# Patient Record
Sex: Female | Born: 1985
Health system: Southern US, Community
[De-identification: ages and names within clinical notes are randomized; demographics above are authoritative.]

## PROBLEM LIST (undated history)

## (undated) DIAGNOSIS — G43009 Migraine without aura, not intractable, without status migrainosus: Secondary | ICD-10-CM

## (undated) DIAGNOSIS — M47816 Spondylosis without myelopathy or radiculopathy, lumbar region: Secondary | ICD-10-CM

## (undated) DIAGNOSIS — K219 Gastro-esophageal reflux disease without esophagitis: Secondary | ICD-10-CM

## (undated) DIAGNOSIS — N83209 Unspecified ovarian cyst, unspecified side: Secondary | ICD-10-CM

## (undated) DIAGNOSIS — N289 Disorder of kidney and ureter, unspecified: Secondary | ICD-10-CM

## (undated) DIAGNOSIS — K589 Irritable bowel syndrome without diarrhea: Secondary | ICD-10-CM

## (undated) DIAGNOSIS — G43909 Migraine, unspecified, not intractable, without status migrainosus: Secondary | ICD-10-CM

## (undated) HISTORY — DX: Morbid (severe) obesity due to excess calories: E66.01

## (undated) HISTORY — DX: Migraine without aura, not intractable, without status migrainosus: G43.009

## (undated) HISTORY — PX: HAND SURGERY: SHX662

---
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
## 2007-08-27 ENCOUNTER — Ambulatory Visit: Payer: Self-pay | Admitting: Cardiology

## 2011-01-08 ENCOUNTER — Ambulatory Visit (HOSPITAL_BASED_OUTPATIENT_CLINIC_OR_DEPARTMENT_OTHER)
Admission: RE | Admit: 2011-01-08 | Discharge: 2011-01-08 | Disposition: A | Discharge status: Home / Self Care | Payer: Federal, State, Local not specified - PPO | Source: Ambulatory Visit | Attending: Orthopedic Surgery | Admitting: Orthopedic Surgery

## 2011-01-08 DIAGNOSIS — F17200 Nicotine dependence, unspecified, uncomplicated: Secondary | ICD-10-CM | POA: Insufficient documentation

## 2011-01-08 DIAGNOSIS — E669 Obesity, unspecified: Secondary | ICD-10-CM | POA: Insufficient documentation

## 2011-01-08 DIAGNOSIS — Z01812 Encounter for preprocedural laboratory examination: Secondary | ICD-10-CM | POA: Insufficient documentation

## 2011-01-08 DIAGNOSIS — W19XXXA Unspecified fall, initial encounter: Secondary | ICD-10-CM | POA: Insufficient documentation

## 2011-01-08 DIAGNOSIS — S62309A Unspecified fracture of unspecified metacarpal bone, initial encounter for closed fracture: Secondary | ICD-10-CM | POA: Insufficient documentation

## 2011-02-13 NOTE — Op Note (Signed)
[name], [name] NO.:  [id number]  MEDICAL RECORD NO.:  [id number]  LOCATION:                                 FACILITY:  PHYSICIAN:  Jones Broom, MD         DATE OF BIRTH:  DATE OF PROCEDURE:  01/08/2011 DATE OF DISCHARGE:                              OPERATIVE REPORT   PREOPERATIVE DIAGNOSIS:  Right fifth metacarpal fracture.  POSTOPERATIVE DIAGNOSIS:  Right fifth metacarpal fracture.  PROCEDURE PERFORMED:  Open reduction and internal fixation right fifth metacarpal fracture.  ATTENDING SURGEON:  Berline Lopes, MD  ASSISTANT:  None.  ANESTHESIA:  GETA.  COMPLICATIONS:  None.  DRAINS:  None.  SPECIMENS:  None.  ESTIMATED BLOOD LOSS:  Minimal.  TOURNIQUET TIME:  38 minutes at 250 mmHg.  INDICATIONS FOR SURGERY:  The patient is a 25 year old healthy female who had a fall and suffered a right fifth metacarpal fracture.  She was seen by my partner, Dr. Althea Charon who evaluated her.  She was found to have about 45 degrees angulation of the fracture with rotational deformity of the finger.  We discussed the possibility of attempt at reduction and splinting but she wanted to go ahead with surgery to get it fixed.  Given the rotational deformity I think it is a reasonable option.  Given the angulation and rotational deformity I felt that plate fixation will be the most reliable method to hold the appropriate reduction.  She understood risks, benefits and alternatives to surgery including but not limited to risk of bleeding, infection, damage to neurovascular structures, risk of nonunion, malunion and potential need for future surgery.  She elected to go forward with surgery.  PROCEDURE:  The patient was identified in the preoperative holding area where I personally marked the operative site after verifying site, side, and procedure with the patient.  She was taken back to the operating room where general anesthesia was induced without  complication.  The right upper extremity was prepped and draped in standard sterile fashion and a nonsterile tourniquet was applied in the upper arm.  The patient did receive IV antibiotics prior to elevation of tourniquet.  The appropriate time-out procedure was carried out.  The limb was exsanguinated and the tourniquet was elevated to 250 mmHg.  An approximately 3-cm incision was made over the dorsal lateral aspect of the fifth metacarpal centered over the palpable fracture site. Dissection was carried down to subcutaneous tissues carefully avoiding any small nerve cutaneous branches.  The extensor tendons were retracted radially and knife was used to come down through the periosteum. Subperiosteal dissection was used to the fracture site and the fracture was completely visualized.  It was cleaned of hematoma and the fracture reduction was obtained with extension at the fracture.  Fluoroscopic imaging demonstrated appropriate fracture reduction.  A 4-hole DePuy hand fracture plate was placed with one compression hole distally.  The proximal hole was drilled, measured and filled bringing the plate down to the bone and then the distal hole was drilled, measured, and filled in compression.  The plate was prebent prior to application and excellent compression was noted at the fracture site after the  placement of a second screw.  Fluoroscopic imaging demonstrated appropriate placement of the screw and plate.  The proximal and distal screws were then drilled, measured and filled with appropriate sized nonlocking screws.  Excellent fixation was noted at each screw.  Final fluoroscopic imaging in AP and lateral planes demonstrated anatomic reduction of the fracture with excellent compression.  The wound was then copiously irrigated with normal saline and the skin was closed with 4-0 Prolene in interrupted fashion.  Sterile dressings were then applied including Adaptic, 4x4s and sterile Webril.   I should say that I did infiltrate the wound with 6 mL of 0.5% Marcaine prior to application of dressing. A well-padded well molded ulnar gutter splint was then applied in the safe position and tourniquet was let down for total tourniquet time of 38 minutes at 250 mmHg.  The patient was then allowed to awaken from general anesthesia, transferred to the stretcher, and taken to the recovery room in stable condition.  POSTOPERATIVE PLAN:  She will be discharged home in stable condition. She will keep the splint clean, dry, and intact until followup.  She will follow up with me in 7-10 days at which point we will get her out of splint, start moving her removable hand-based brace for protection.     Jones Broom, MD     JC/MEDQ  D:  01/08/2011  T:  01/09/2011  Job:  [id number]  Electronically Signed by Jones Broom  on 02/13/2011 03:55:40 PM

## 2011-09-05 ENCOUNTER — Other Ambulatory Visit (HOSPITAL_COMMUNITY)
Admission: RE | Admit: 2011-09-05 | Discharge: 2011-09-05 | Disposition: A | Discharge status: Home / Self Care | Payer: Federal, State, Local not specified - PPO | Source: Ambulatory Visit | Attending: Family Medicine | Admitting: Family Medicine

## 2011-09-05 ENCOUNTER — Other Ambulatory Visit: Payer: Self-pay | Admitting: Family Medicine

## 2011-09-05 DIAGNOSIS — Z124 Encounter for screening for malignant neoplasm of cervix: Secondary | ICD-10-CM | POA: Insufficient documentation

## 2011-12-25 ENCOUNTER — Emergency Department (HOSPITAL_COMMUNITY): Payer: Federal, State, Local not specified - PPO

## 2011-12-25 ENCOUNTER — Emergency Department (HOSPITAL_COMMUNITY)
Admission: EM | Admit: 2011-12-25 | Discharge: 2011-12-25 | Disposition: A | Discharge status: Home / Self Care | Payer: Federal, State, Local not specified - PPO | Attending: Emergency Medicine | Admitting: Emergency Medicine

## 2011-12-25 ENCOUNTER — Encounter (HOSPITAL_COMMUNITY): Payer: Self-pay | Admitting: *Deleted

## 2011-12-25 DIAGNOSIS — K589 Irritable bowel syndrome without diarrhea: Secondary | ICD-10-CM | POA: Insufficient documentation

## 2011-12-25 DIAGNOSIS — F17200 Nicotine dependence, unspecified, uncomplicated: Secondary | ICD-10-CM | POA: Insufficient documentation

## 2011-12-25 DIAGNOSIS — N83209 Unspecified ovarian cyst, unspecified side: Secondary | ICD-10-CM | POA: Insufficient documentation

## 2011-12-25 DIAGNOSIS — R109 Unspecified abdominal pain: Secondary | ICD-10-CM

## 2011-12-25 DIAGNOSIS — K219 Gastro-esophageal reflux disease without esophagitis: Secondary | ICD-10-CM | POA: Insufficient documentation

## 2011-12-25 DIAGNOSIS — R110 Nausea: Secondary | ICD-10-CM | POA: Insufficient documentation

## 2011-12-25 DIAGNOSIS — N949 Unspecified condition associated with female genital organs and menstrual cycle: Secondary | ICD-10-CM | POA: Insufficient documentation

## 2011-12-25 DIAGNOSIS — K5900 Constipation, unspecified: Secondary | ICD-10-CM

## 2011-12-25 HISTORY — DX: Gastro-esophageal reflux disease without esophagitis: K21.9

## 2011-12-25 HISTORY — DX: Unspecified ovarian cyst, unspecified side: N83.209

## 2011-12-25 HISTORY — DX: Irritable bowel syndrome, unspecified: K58.9

## 2011-12-25 LAB — DIFFERENTIAL
Basophils Absolute: 0.1 10*3/uL (ref 0.0–0.1)
Basophils Relative: 1 % (ref 0–1)
Eosinophils Absolute: 0.3 10*3/uL (ref 0.0–0.7)
Eosinophils Relative: 4 % (ref 0–5)
Monocytes Absolute: 0.3 10*3/uL (ref 0.1–1.0)
Neutro Abs: 2.5 10*3/uL (ref 1.7–7.7)

## 2011-12-25 LAB — CBC
Hemoglobin: 12.9 g/dL (ref 12.0–15.0)
MCH: 24.9 pg — ABNORMAL LOW (ref 26.0–34.0)
Platelets: 508 10*3/uL — ABNORMAL HIGH (ref 150–400)
RBC: 5.19 MIL/uL — ABNORMAL HIGH (ref 3.87–5.11)
WBC: 6.3 10*3/uL (ref 4.0–10.5)

## 2011-12-25 LAB — WET PREP, GENITAL
Clue Cells Wet Prep HPF POC: NONE SEEN
Trich, Wet Prep: NONE SEEN
Yeast Wet Prep HPF POC: NONE SEEN

## 2011-12-25 LAB — URINALYSIS, ROUTINE W REFLEX MICROSCOPIC
Protein, ur: NEGATIVE mg/dL
pH: 6 (ref 5.0–8.0)

## 2011-12-25 LAB — POCT I-STAT, CHEM 8
BUN: 6 mg/dL (ref 6–23)
Chloride: 105 mEq/L (ref 96–112)
Creatinine, Ser: 1.1 mg/dL (ref 0.50–1.10)
Sodium: 143 mEq/L (ref 135–145)

## 2011-12-25 LAB — URINE MICROSCOPIC-ADD ON

## 2011-12-25 MED ORDER — HYDROCODONE-ACETAMINOPHEN 5-500 MG PO TABS: 1.0000 | ORAL_TABLET | Freq: Four times a day (QID) | ORAL | Status: AC | PRN

## 2011-12-25 NOTE — Discharge Instructions (Signed)
Take miralax twice a day for constipation. Exercise regularly.  Vicodin for severe pain only as needed. Your labs and x-ray otherwise are normal. Please follow up with you GYN and stomach specialist as soon as able for further testing.   Constipation in Adults Constipation is having fewer than 2 bowel movements per week. Usually, the stools are hard. As we grow older, constipation is more common. If you try to fix constipation with laxatives, the problem may get worse. This is because laxatives taken over a long period of time make the colon muscles weaker. A low-fiber diet, not taking in enough fluids, and taking some medicines may make these problems worse. MEDICATIONS THAT MAY CAUSE CONSTIPATION  Water pills (diuretics).   Calcium channel blockers (used to control blood pressure and for the heart).   Certain pain medicines (narcotics).   Anticholinergics.   Anti-inflammatory agents.   Antacids that contain aluminum.  DISEASES THAT CONTRIBUTE TO CONSTIPATION  Diabetes.   Parkinson's disease.   Dementia.   Stroke.   Depression.   Illnesses that cause problems with salt and water metabolism.  HOME CARE INSTRUCTIONS   Constipation is usually best cared for without medicines. Increasing dietary fiber and eating more fruits and vegetables is the best way to manage constipation.   Slowly increase fiber intake to 25 to 38 grams per day. Whole grains, fruits, vegetables, and legumes are good sources of fiber. A dietitian can further help you incorporate high-fiber foods into your diet.   Drink enough water and fluids to keep your urine clear or pale yellow.   A fiber supplement may be added to your diet if you cannot get enough fiber from foods.   Increasing your activities also helps improve regularity.   Suppositories, as suggested by your caregiver, will also help. If you are using antacids, such as aluminum or calcium containing products, it will be helpful to switch to  products containing magnesium if your caregiver says it is okay.   If you have been given a liquid injection (enema) today, this is only a temporary measure. It should not be relied on for treatment of longstanding (chronic) constipation.   Stronger measures, such as magnesium sulfate, should be avoided if possible. This may cause uncontrollable diarrhea. Using magnesium sulfate may not allow you time to make it to the bathroom.  SEEK IMMEDIATE MEDICAL CARE IF:   There is bright red blood in the stool.   The constipation stays for more than 4 days.   There is belly (abdominal) or rectal pain.   You do not seem to be getting better.   You have any questions or concerns.  MAKE SURE YOU:   Understand these instructions.   Will watch your condition.   Will get help right away if you are not doing well or get worse.  Document Released: 04/06/2004 Document Revised: 06/28/2011 Document Reviewed: 06/12/2011 [facility] Patient Information 2012 [place], [place].  Ovarian Cyst The ovaries are small organs that are on each side of the uterus. The ovaries are the organs that produce the female hormones, estrogen and progesterone. An ovarian cyst is a sac filled with fluid that can vary in its size. It is normal for a small cyst to form in women who are in the childbearing age and who have menstrual periods. This type of cyst is called a follicle cyst that becomes an ovulation cyst (corpus luteum cyst) after it produces the women's egg. It later goes away on its own if the woman does  not become pregnant. There are other kinds of ovarian cysts that may cause problems and may need to be treated. The most serious problem is a cyst with cancer. It should be noted that menopausal women who have an ovarian cyst are at a higher risk of it being a cancer cyst. They should be evaluated very quickly, thoroughly and followed closely. This is especially true in menopausal women because of the high rate of ovarian  cancer in women in menopause. CAUSES AND TYPES OF OVARIAN CYSTS:  FUNCTIONAL CYST: The follicle/corpus luteum cyst is a functional cyst that occurs every month during ovulation with the menstrual cycle. They go away with the next menstrual cycle if the woman does not get pregnant. Usually, there are no symptoms with a functional cyst.   ENDOMETRIOMA CYST: This cyst develops from the lining of the uterus tissue. This cyst gets in or on the ovary. It grows every month from the bleeding during the menstrual period. It is also called a "chocolate cyst" because it becomes filled with blood that turns brown. This cyst can cause pain in the lower abdomen during intercourse and with your menstrual period.   CYSTADENOMA CYST: This cyst develops from the cells on the outside of the ovary. They usually are not cancerous. They can get very big and cause lower abdomen pain and pain with intercourse. This type of cyst can twist on itself, cut off its blood supply and cause severe pain. It also can easily rupture and cause a lot of pain.   DERMOID CYST: This type of cyst is sometimes found in both ovaries. They are found to have different kinds of body tissue in the cyst. The tissue includes skin, teeth, hair, and/or cartilage. They usually do not have symptoms unless they get very big. Dermoid cysts are rarely cancerous.   POLYCYSTIC OVARY: This is a rare condition with hormone problems that produces many small cysts on both ovaries. The cysts are follicle-like cysts that never produce an egg and become a corpus luteum. It can cause an increase in body weight, infertility, acne, increase in body and facial hair and lack of menstrual periods or rare menstrual periods. Many women with this problem develop type 2 diabetes. The exact cause of this problem is unknown. A polycystic ovary is rarely cancerous.   THECA LUTEIN CYST: Occurs when too much hormone (human chorionic gonadotropin) is produced and over-stimulates the  ovaries to produce an egg. They are frequently seen when doctors stimulate the ovaries for invitro-fertilization (test tube babies).   LUTEOMA CYST: This cyst is seen during pregnancy. Rarely it can cause an obstruction to the birth canal during labor and delivery. They usually go away after delivery.  SYMPTOMS   Pelvic pain or pressure.   Pain during sexual intercourse.   Increasing girth (swelling) of the abdomen.   Abnormal menstrual periods.   Increasing pain with menstrual periods.   You stop having menstrual periods and you are not pregnant.  DIAGNOSIS  The diagnosis can be made during:  Routine or annual pelvic examination (common).   Ultrasound.   X-ray of the pelvis.   CT Scan.   MRI.   Blood tests.  TREATMENT   Treatment may only be to follow the cyst monthly for 2 to 3 months with your caregiver. Many go away on their own, especially functional cysts.   May be aspirated (drained) with a long needle with ultrasound, or by laparoscopy (inserting a tube into the pelvis through a small incision).  The whole cyst can be removed by laparoscopy.   Sometimes the cyst may need to be removed through an incision in the lower abdomen.   Hormone treatment is sometimes used to help dissolve certain cysts.   Birth control pills are sometimes used to help dissolve certain cysts.  HOME CARE INSTRUCTIONS  Follow your caregiver's advice regarding:  Medicine.   Follow up visits to evaluate and treat the cyst.   You may need to come back or make an appointment with another caregiver, to find the exact cause of your cyst, if your caregiver is not a gynecologist.   Get your yearly and recommended pelvic examinations and Pap tests.   Let your caregiver know if you have had an ovarian cyst in the past.  SEEK MEDICAL CARE IF:   Your periods are late, irregular, they stop, or are painful.   Your stomach (abdomen) or pelvic pain does not go away.   Your stomach becomes  larger or swollen.   You have pressure on your bladder or trouble emptying your bladder completely.   You have painful sexual intercourse.   You have feelings of fullness, pressure, or discomfort in your stomach.   You lose weight for no apparent reason.   You feel generally ill.   You become constipated.   You lose your appetite.   You develop acne.   You have an increase in body and facial hair.   You are gaining weight, without changing your exercise and eating habits.   You think you are pregnant.  SEEK IMMEDIATE MEDICAL CARE IF:   You have increasing abdominal pain.   You feel sick to your stomach (nausea) and/or vomit.   You develop a fever that comes on suddenly.   You develop abdominal pain during a bowel movement.   Your menstrual periods become heavier than usual.  Document Released: 07/09/2005 Document Revised: 06/28/2011 Document Reviewed: 05/12/2009 [facility] Patient Information 2012 [place], [place].

## 2011-12-25 NOTE — ED Notes (Signed)
Patient is resting comfortably. 

## 2011-12-25 NOTE — ED Provider Notes (Signed)
History     CSN: [id number]  Arrival date & time 12/25/11  1604   First MD Initiated Contact with Patient 12/25/11 1729      Chief Complaint  Patient presents with  . Pelvic Pain    (Consider location/radiation/quality/duration/timing/severity/associated sxs/prior treatment) Patient is a 26 y.o. female presenting with abdominal pain. The history is provided by the patient.  Abdominal Pain The primary symptoms of the illness include abdominal pain and nausea. The primary symptoms of the illness do not include fever, vomiting, diarrhea, dysuria, vaginal discharge or vaginal bleeding. The onset of the illness was gradual.  The patient states that she believes she is currently not pregnant. Symptoms associated with the illness do not include chills, constipation, urgency, hematuria or frequency.  Pt states she has had left lower abdominal pain for several months now. Has worsened in the last week. States went to her OB/GYN, had US done last week, which showed an ovarian cyst. States he wanted to start her on OCP to help with the cyst, however, she refused, states "I dont like to take medicine." Pt states she has also had problems with constipation and IBS. States none of the over the counter medications help her. Stats she goes to digestive health specialist. States unable to go for another week. Has ran out of medications they give her and has not had a bowel movement for 4 days. States, "I just need to know what is causing my pain, and if it a cyst, i want it out."   Past Medical History  Diagnosis Date  . Ovarian cyst   . IBS (irritable bowel syndrome)   . GERD (gastroesophageal reflux disease)     Past Surgical History  Procedure Date  . Hand surgery     No family history on file.  History  Substance Use Topics  . Smoking status: Current Some Day Smoker  . Smokeless tobacco: Not on file  . Alcohol Use: Yes     occasionally    OB History    Grav Para Term Preterm Abortions  TAB SAB Ect Mult Living                  Review of Systems  Constitutional: Negative for fever and chills.  Respiratory: Negative.   Cardiovascular: Negative.   Gastrointestinal: Positive for nausea and abdominal pain. Negative for vomiting, diarrhea and constipation.  Genitourinary: Positive for pelvic pain. Negative for dysuria, urgency, frequency, hematuria, vaginal bleeding and vaginal discharge.  Musculoskeletal: Negative.   Skin: Negative.   Neurological: Negative for dizziness and weakness.    Allergies  Penicillins  Home Medications  No current outpatient prescriptions on file.  BP 109/84  Pulse 71  Temp(Src) 98.4 F (36.9 C) (Oral)  Resp 18  SpO2 96%  LMP 12/24/2011  Physical Exam  Nursing note and vitals reviewed. Constitutional: She is oriented to person, place, and time. She appears well-developed and well-nourished. No distress.  HENT:  Head: Normocephalic.  Eyes: Conjunctivae are normal.  Cardiovascular: Normal rate, regular rhythm and normal heart sounds.   Pulmonary/Chest: Effort normal and breath sounds normal. No respiratory distress. She has no wheezes. She has no rales.  Abdominal: Soft. Bowel sounds are normal.       LLQ tenderness. No guarding. No rebound tenderness  Neurological: She is alert and oriented to person, place, and time.  Skin: Skin is warm and dry.    ED Course  Procedures (including critical care time)  Pt with constipation, chronic, and left  ovarian cyst diagnosed by [place] just a week ago, here bc pain continues. Will do pelvic exam, labs, x-ray.  Results for orders placed during the hospital encounter of 12/25/11  CBC      Component Value Range   WBC 6.3  4.0 - 10.5 (K/uL)   RBC 5.19 (*) 3.87 - 5.11 (MIL/uL)   Hemoglobin 12.9  12.0 - 15.0 (g/dL)   HCT 16.1  09.6 - 04.5 (%)   MCV 78.4  78.0 - 100.0 (fL)   MCH 24.9 (*) 26.0 - 34.0 (pg)   MCHC 31.7  30.0 - 36.0 (g/dL)   RDW 40.9  81.1 - 91.4 (%)   Platelets 508 (*) 150 - 400  (K/uL)  DIFFERENTIAL      Component Value Range   Neutrophils Relative 39 (*) 43 - 77 (%)   Neutro Abs 2.5  1.7 - 7.7 (K/uL)   Lymphocytes Relative 51 (*) 12 - 46 (%)   Lymphs Abs 3.2  0.7 - 4.0 (K/uL)   Monocytes Relative 5  3 - 12 (%)   Monocytes Absolute 0.3  0.1 - 1.0 (K/uL)   Eosinophils Relative 4  0 - 5 (%)   Eosinophils Absolute 0.3  0.0 - 0.7 (K/uL)   Basophils Relative 1  0 - 1 (%)   Basophils Absolute 0.1  0.0 - 0.1 (K/uL)  URINALYSIS, ROUTINE W REFLEX MICROSCOPIC      Component Value Range   Color, Urine YELLOW  YELLOW    APPearance CLEAR  CLEAR    Specific Gravity, Urine 1.028  1.005 - 1.030    pH 6.0  5.0 - 8.0    Glucose, UA NEGATIVE  NEGATIVE (mg/dL)   Hgb urine dipstick MODERATE (*) NEGATIVE    Bilirubin Urine NEGATIVE  NEGATIVE    Ketones, ur NEGATIVE  NEGATIVE (mg/dL)   Protein, ur NEGATIVE  NEGATIVE (mg/dL)   Urobilinogen, UA 0.2  0.0 - 1.0 (mg/dL)   Nitrite NEGATIVE  NEGATIVE    Leukocytes, UA NEGATIVE  NEGATIVE   GC/CHLAMYDIA PROBE AMP, GENITAL      Component Value Range   GC Probe Amp, Genital NEGATIVE  NEGATIVE    Chlamydia, DNA Probe NEGATIVE  NEGATIVE   WET PREP, GENITAL      Component Value Range   Yeast Wet Prep HPF POC NONE SEEN  NONE SEEN    Trich, Wet Prep NONE SEEN  NONE SEEN    Clue Cells Wet Prep HPF POC NONE SEEN  NONE SEEN    WBC, Wet Prep HPF POC FEW (*) NONE SEEN   POCT I-STAT, CHEM 8      Component Value Range   Sodium 143  135 - 145 (mEq/L)   Potassium 4.0  3.5 - 5.1 (mEq/L)   Chloride 105  96 - 112 (mEq/L)   BUN 6  6 - 23 (mg/dL)   Creatinine, Ser 7.82  0.50 - 1.10 (mg/dL)   Glucose, Bld 92  70 - 99 (mg/dL)   Calcium, Ion 9.56  1.12 - 1.32 (mmol/L)   TCO2 26  0 - 100 (mmol/L)   Hemoglobin 14.6  12.0 - 15.0 (g/dL)   HCT 21.3  08.6 - 57.8 (%)  POCT PREGNANCY, URINE      Component Value Range   Preg Test, Ur NEGATIVE  NEGATIVE   URINE MICROSCOPIC-ADD ON      Component Value Range   RBC / HPF 7-10  <3 (RBC/hpf)   Bacteria, UA  FEW (*) RARE  Urine-Other MUCOUS PRESENT     Dg Abd 2 Views  12/25/2011  *RADIOLOGY REPORT*  Clinical Data: Abdominal pain.  History of pelvic cyst.  ABDOMEN - 2 VIEW  Comparison: No priors.  Findings: Supine and upright views of the abdomen demonstrate gas and stool scattered throughout the colon extending to the level of the distal rectum.  No pathologic distension of small bowel.  No gross evidence of pneumoperitoneum.  IMPRESSION: 1.  Nonobstructive bowel gas pattern. 2.  No pneumoperitoneum.  Original Report Authenticated By: Florencia Reasons, M.D.    X-ray reviewed by me, large amount of stool which may explain pts symptoms vs ovarian cyst. Will treat with pain medications, follow up with her GI and GYN. Labs unremarkable. VS normal. No surgical abdomen. Pelvic unremarkable.   No diagnosis found.    MDM          Lottie Mussel, PA 12/27/11 [phone]

## 2011-12-25 NOTE — ED Notes (Signed)
Pt c/o pelvic pain for a few months. Was told she had an ovarian cyst, and is here for "a second opinion." Saw her obgyn last week and was told to start PO birth control to help symptoms. Pt sts she has anxiety with new medications and did not feel comfortable taking it. "Just wants the cyst out, still having pain." Denies vaginal bleeding/discharge, also Hx IBS.

## 2011-12-28 NOTE — ED Provider Notes (Signed)
Medical screening examination/treatment/procedure(s) were performed by non-physician practitioner and as supervising physician I was immediately available for consultation/collaboration.   Lyanne Co, MD 12/28/11 [phone]

## 2013-06-19 ENCOUNTER — Emergency Department (HOSPITAL_COMMUNITY)
Admission: EM | Admit: 2013-06-19 | Discharge: 2013-06-19 | Disposition: A | Discharge status: Home / Self Care | Payer: No Typology Code available for payment source | Attending: Emergency Medicine | Admitting: Emergency Medicine

## 2013-06-19 ENCOUNTER — Encounter (HOSPITAL_COMMUNITY): Payer: Self-pay | Admitting: Emergency Medicine

## 2013-06-19 ENCOUNTER — Emergency Department (HOSPITAL_COMMUNITY): Payer: No Typology Code available for payment source

## 2013-06-19 DIAGNOSIS — Y9389 Activity, other specified: Secondary | ICD-10-CM | POA: Insufficient documentation

## 2013-06-19 DIAGNOSIS — M79604 Pain in right leg: Secondary | ICD-10-CM

## 2013-06-19 DIAGNOSIS — S99919A Unspecified injury of unspecified ankle, initial encounter: Secondary | ICD-10-CM | POA: Insufficient documentation

## 2013-06-19 DIAGNOSIS — Z8742 Personal history of other diseases of the female genital tract: Secondary | ICD-10-CM | POA: Insufficient documentation

## 2013-06-19 DIAGNOSIS — Z8719 Personal history of other diseases of the digestive system: Secondary | ICD-10-CM | POA: Insufficient documentation

## 2013-06-19 DIAGNOSIS — F17200 Nicotine dependence, unspecified, uncomplicated: Secondary | ICD-10-CM | POA: Insufficient documentation

## 2013-06-19 DIAGNOSIS — Z791 Long term (current) use of non-steroidal anti-inflammatories (NSAID): Secondary | ICD-10-CM | POA: Insufficient documentation

## 2013-06-19 DIAGNOSIS — Z880 Allergy status to penicillin: Secondary | ICD-10-CM | POA: Insufficient documentation

## 2013-06-19 DIAGNOSIS — Y92410 Unspecified street and highway as the place of occurrence of the external cause: Secondary | ICD-10-CM | POA: Insufficient documentation

## 2013-06-19 DIAGNOSIS — S8990XA Unspecified injury of unspecified lower leg, initial encounter: Secondary | ICD-10-CM | POA: Insufficient documentation

## 2013-06-19 MED ORDER — NAPROXEN 500 MG PO TABS: 500.0000 mg | ORAL_TABLET | Freq: Two times a day (BID) | ORAL | Status: DC

## 2013-06-19 NOTE — ED Provider Notes (Signed)
Medical screening examination/treatment/procedure(s) were performed by non-physician practitioner and as supervising physician I was immediately available for consultation/collaboration.  EKG Interpretation   None        Hurman Horn, MD 06/19/13 2000
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
## 2013-09-28 ENCOUNTER — Encounter (HOSPITAL_COMMUNITY): Payer: Self-pay | Admitting: Emergency Medicine

## 2013-09-28 ENCOUNTER — Emergency Department (HOSPITAL_COMMUNITY): Payer: BC Managed Care – PPO

## 2013-09-28 ENCOUNTER — Emergency Department (HOSPITAL_COMMUNITY)
Admission: EM | Admit: 2013-09-28 | Discharge: 2013-09-28 | Disposition: A | Discharge status: Home / Self Care | Payer: BC Managed Care – PPO | Attending: Emergency Medicine | Admitting: Emergency Medicine

## 2013-09-28 DIAGNOSIS — Z880 Allergy status to penicillin: Secondary | ICD-10-CM | POA: Insufficient documentation

## 2013-09-28 DIAGNOSIS — M542 Cervicalgia: Secondary | ICD-10-CM

## 2013-09-28 DIAGNOSIS — Y939 Activity, unspecified: Secondary | ICD-10-CM | POA: Insufficient documentation

## 2013-09-28 DIAGNOSIS — F17200 Nicotine dependence, unspecified, uncomplicated: Secondary | ICD-10-CM | POA: Insufficient documentation

## 2013-09-28 DIAGNOSIS — Z8742 Personal history of other diseases of the female genital tract: Secondary | ICD-10-CM | POA: Insufficient documentation

## 2013-09-28 DIAGNOSIS — Z8719 Personal history of other diseases of the digestive system: Secondary | ICD-10-CM | POA: Insufficient documentation

## 2013-09-28 DIAGNOSIS — S0993XA Unspecified injury of face, initial encounter: Secondary | ICD-10-CM | POA: Insufficient documentation

## 2013-09-28 DIAGNOSIS — Y929 Unspecified place or not applicable: Secondary | ICD-10-CM | POA: Insufficient documentation

## 2013-09-28 DIAGNOSIS — S199XXA Unspecified injury of neck, initial encounter: Secondary | ICD-10-CM

## 2013-09-28 DIAGNOSIS — IMO0002 Reserved for concepts with insufficient information to code with codable children: Secondary | ICD-10-CM | POA: Insufficient documentation

## 2013-09-28 DIAGNOSIS — S0990XA Unspecified injury of head, initial encounter: Secondary | ICD-10-CM | POA: Insufficient documentation

## 2013-09-28 MED ORDER — KETOROLAC TROMETHAMINE 15 MG/ML IJ SOLN
30.0000 mg | Freq: Once | INTRAMUSCULAR | Status: AC
  Administered 2013-09-28: 30 mg via INTRAMUSCULAR
  Filled 2013-09-28: qty 2

## 2013-09-28 MED ORDER — IBUPROFEN 600 MG PO TABS: 600.0000 mg | ORAL_TABLET | Freq: Four times a day (QID) | ORAL | Status: DC | PRN
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
## 2013-09-28 NOTE — ED Provider Notes (Signed)
CSN: [id number]     Arrival date & time 09/28/13  1910 History  This chart was scribed for non-physician practitioner Garald Balding, NP, working with Dot Lanes, MD, by Neta Ehlers, ED Scribe. This patient was seen in room WTR5/WTR5 and the patient's care was started at 9:13 PM.  First MD Initiated Contact with Patient 09/28/13 2057     Chief Complaint  Patient presents with  . Head Injury    The history is provided by the patient. No language interpreter was used.   HPI Comments: [name] is a 28 y.o. female who presents to the Emergency Department complaining of a headache which began six hours ago after hitting the top of her head against the car door frame. She states she is experiencing pain to her neck as well. The headache and neck pain began immediately after the impact. She also felt very tired after hitting her head. She denies LOC and nausea. She also denies using any medication to address the pain. She denies a h/o head injuries.   Past Medical History  Diagnosis Date  . Ovarian cyst   . IBS (irritable bowel syndrome)   . GERD (gastroesophageal reflux disease)    Past Surgical History  Procedure Laterality Date  . Hand surgery     History reviewed. No pertinent family history. History  Substance Use Topics  . Smoking status: Current Some Day Smoker  . Smokeless tobacco: Not on file  . Alcohol Use: Yes     Comment: occasionally   No OB history provided.  Review of Systems  Constitutional:       Tired.   Eyes: Negative for visual disturbance.  Gastrointestinal: Negative for nausea.  Musculoskeletal: Positive for neck pain. Negative for neck stiffness.  Neurological: Positive for headaches. Negative for dizziness and syncope.  All other systems reviewed and are negative.    Allergies  Penicillins  Home Medications   Current Outpatient Rx  Name  Route  Sig  Dispense  Refill  . ibuprofen (ADVIL,MOTRIN) 600 MG tablet   Oral   Take 1 tablet  (600 mg total) by mouth every 6 (six) hours as needed.   30 tablet   0    Triage Vitals: BP 126/84  Pulse 80  Temp(Src) 98.7 F (37.1 C) (Oral)  Resp 18  SpO2 98%  LMP 09/05/2013  Physical Exam  Nursing note and vitals reviewed. Constitutional: She is oriented to person, place, and time. She appears well-developed and well-nourished. No distress.  HENT:  Head: Normocephalic and atraumatic.  Right Ear: External ear normal.  Left Ear: External ear normal.  Mouth/Throat: Oropharynx is clear and moist.  Eyes: EOM are normal. Pupils are equal, round, and reactive to light.  Neck: Normal range of motion. Neck supple. Muscular tenderness present. No spinous process tenderness present.  Cardiovascular: Normal rate.   Pulmonary/Chest: Effort normal. No respiratory distress.  Musculoskeletal: Normal range of motion.  Neurological: She is alert and oriented to person, place, and time.  Skin: Skin is warm and dry.  No break in the skin.   Psychiatric: She has a normal mood and affect. Her behavior is normal.    ED Course  Procedures (including critical care time)  DIAGNOSTIC STUDIES: Oxygen Saturation is 98% on room air, normal by my interpretation.    COORDINATION OF CARE:  9:16 PM- Discussed treatment plan with patient, which includes pain medication, and the patient agreed to the plan.   Labs Review Labs Reviewed - No  data to display Imaging Review Dg Cervical Spine Complete  09/28/2013   CLINICAL DATA:  Recent traumatic injury and pain  EXAM: CERVICAL SPINE  4+ VIEWS  COMPARISON:  None.  FINDINGS: There is no evidence of cervical spine fracture or prevertebral soft tissue swelling. Alignment is normal. No other significant bone abnormalities are identified.  IMPRESSION: No acute abnormality is noted.   Electronically Signed   By: Inez Catalina M.D.   On: 09/28/2013 21:37   Ct Head Wo Contrast  09/28/2013   CLINICAL DATA:  Recent traumatic injury and pain  EXAM: CT HEAD WITHOUT  CONTRAST  TECHNIQUE: Contiguous axial images were obtained from the base of the skull through the vertex without intravenous contrast.  COMPARISON:  None.  FINDINGS: The bony calvarium is intact. The ventricles are of normal size and configuration. No findings to suggest acute hemorrhage, acute infarction or space-occupying mass lesion are noted.  IMPRESSION: No acute abnormality noted.   Electronically Signed   By: Inez Catalina M.D.   On: 09/28/2013 22:09     EKG Interpretation None      MDM   Final diagnoses:  Head injury  Cervical pain    I personally performed the services described in this document, which was scribed in my presence. The recorded information has been reviewed and is accurate.        Garald Balding, NP 09/28/13 [phone]

## 2013-09-28 NOTE — ED Notes (Signed)
Pt states she was getting in her car this am and hit her head. Pt states every since this am she has had headache and just has not felt right.

## 2013-09-29 NOTE — ED Provider Notes (Signed)
Medical screening examination/treatment/procedure(s) were performed by non-physician practitioner and as supervising physician I was immediately available for consultation/collaboration.   Dot Lanes, MD 09/29/13 1249

## 2014-03-03 ENCOUNTER — Emergency Department (HOSPITAL_COMMUNITY)
Admission: EM | Admit: 2014-03-03 | Discharge: 2014-03-03 | Disposition: A | Discharge status: Home / Self Care | Payer: BC Managed Care – PPO | Attending: Emergency Medicine | Admitting: Emergency Medicine

## 2014-03-03 ENCOUNTER — Encounter (HOSPITAL_COMMUNITY): Payer: Self-pay | Admitting: Emergency Medicine

## 2014-03-03 DIAGNOSIS — Z3202 Encounter for pregnancy test, result negative: Secondary | ICD-10-CM | POA: Insufficient documentation

## 2014-03-03 DIAGNOSIS — R300 Dysuria: Secondary | ICD-10-CM | POA: Insufficient documentation

## 2014-03-03 DIAGNOSIS — N946 Dysmenorrhea, unspecified: Secondary | ICD-10-CM | POA: Diagnosis not present

## 2014-03-03 DIAGNOSIS — Z8719 Personal history of other diseases of the digestive system: Secondary | ICD-10-CM | POA: Diagnosis not present

## 2014-03-03 DIAGNOSIS — F17200 Nicotine dependence, unspecified, uncomplicated: Secondary | ICD-10-CM | POA: Insufficient documentation

## 2014-03-03 DIAGNOSIS — Z79899 Other long term (current) drug therapy: Secondary | ICD-10-CM | POA: Insufficient documentation

## 2014-03-03 DIAGNOSIS — N898 Other specified noninflammatory disorders of vagina: Secondary | ICD-10-CM | POA: Insufficient documentation

## 2014-03-03 DIAGNOSIS — Z880 Allergy status to penicillin: Secondary | ICD-10-CM | POA: Diagnosis not present

## 2014-03-03 DIAGNOSIS — N949 Unspecified condition associated with female genital organs and menstrual cycle: Secondary | ICD-10-CM | POA: Diagnosis not present

## 2014-03-03 DIAGNOSIS — R102 Pelvic and perineal pain: Secondary | ICD-10-CM

## 2014-03-03 LAB — BASIC METABOLIC PANEL
ANION GAP: 13 (ref 5–15)
BUN: 10 mg/dL (ref 6–23)
CO2: 20 mEq/L (ref 19–32)
CREATININE: 0.91 mg/dL (ref 0.50–1.10)
Calcium: 9.1 mg/dL (ref 8.4–10.5)
Chloride: 102 mEq/L (ref 96–112)
GFR calc Af Amer: >90 mL/min (ref >90)
GFR, EST NON AFRICAN AMERICAN: 86 mL/min — AB (ref >90)
Glucose, Bld: 159 mg/dL — ABNORMAL HIGH (ref 70–99)
Potassium: 3.9 mEq/L (ref 3.7–5.3)
SODIUM: 135 meq/L — AB (ref 137–147)

## 2014-03-03 LAB — CBC
HEMATOCRIT: 39.2 % (ref 36.0–46.0)
Hemoglobin: 12.4 g/dL (ref 12.0–15.0)
MCH: 24.7 pg — AB (ref 26.0–34.0)
MCHC: 31.6 g/dL (ref 30.0–36.0)
MCV: 77.9 fL — AB (ref 78.0–100.0)
Platelets: 415 10*3/uL — ABNORMAL HIGH (ref 150–400)
RBC: 5.03 MIL/uL (ref 3.87–5.11)
RDW: 14.8 % (ref 11.5–15.5)
WBC: 6.7 10*3/uL (ref 4.0–10.5)

## 2014-03-03 LAB — URINALYSIS, ROUTINE W REFLEX MICROSCOPIC
BILIRUBIN URINE: NEGATIVE
GLUCOSE, UA: NEGATIVE mg/dL
KETONES UR: NEGATIVE mg/dL
LEUKOCYTES UA: NEGATIVE
Nitrite: NEGATIVE
PH: 6 (ref 5.0–8.0)
Protein, ur: NEGATIVE mg/dL
Specific Gravity, Urine: 1.028 (ref 1.005–1.030)
Urobilinogen, UA: 1 mg/dL (ref 0.0–1.0)

## 2014-03-03 LAB — URINE MICROSCOPIC-ADD ON

## 2014-03-03 LAB — WET PREP, GENITAL
Clue Cells Wet Prep HPF POC: NONE SEEN
TRICH WET PREP: NONE SEEN
Yeast Wet Prep HPF POC: NONE SEEN

## 2014-03-03 LAB — POC URINE PREG, ED: Preg Test, Ur: NEGATIVE

## 2014-03-03 MED ORDER — PHENAZOPYRIDINE HCL 200 MG PO TABS
200.0000 mg | ORAL_TABLET | Freq: Three times a day (TID) | ORAL | Status: DC
Start: 2014-03-03 — End: 2015-08-19

## 2014-03-03 MED ORDER — TRAMADOL HCL 50 MG PO TABS: 50.0000 mg | ORAL_TABLET | Freq: Four times a day (QID) | ORAL | Status: DC | PRN

## 2014-03-03 NOTE — ED Notes (Signed)
Pt reports intermittent abd/pelvic pain "for a few months" - admits to some vaginal bleeding r/t menses however denies any other associating symptoms. Pt is pleasant and cooperative, A&Ox4 in no acute distress.

## 2014-03-03 NOTE — ED Provider Notes (Signed)
CSN: [id number]     Arrival date & time 03/03/14  67 History   First MD Initiated Contact with Patient 03/03/14 2052     Chief Complaint  Patient presents with  . Flank Pain  . Pelvic Pain     (Consider location/radiation/quality/duration/timing/severity/associated sxs/prior Treatment) HPI  Patient to the ER with complaints of pelvic pain on and off for the past few months. She has no pain at rest but worst pain with urination. Denies vaginal discharged and is currently on cycle. She is concerned that she may have a UTI and has bilateral leg aching as well. PMH significant for ovarian cyst, IBS and GERD. Denies abdominal pains, nausea, vomiting, diarrhea, weakness or fever.  Past Medical History  Diagnosis Date  . Ovarian cyst   . IBS (irritable bowel syndrome)   . GERD (gastroesophageal reflux disease)    Past Surgical History  Procedure Laterality Date  . Hand surgery     No family history on file. History  Substance Use Topics  . Smoking status: Current Some Day Smoker  . Smokeless tobacco: Not on file  . Alcohol Use: Yes     Comment: occasionally   OB History   Grav Para Term Preterm Abortions TAB SAB Ect Mult Living                 Review of Systems  Review of Systems  Gen: no weight loss, fevers, chills, night sweats  Eyes: no discharge or drainage, no occular pain or visual changes  Nose: no epistaxis or rhinorrhea  Mouth: no dental pain, no sore throat  Neck: no neck pain  Lungs:No wheezing or hemoptysis No coughing CV:  No palpitations, dependent edema or orthopnea. No chest pain Abd: no diarrhea. No nausea or vomiting, No abdominal pain  GU: + dysuria and No gross hematuria  MSK:  No muscle weakness, No  pain Neuro: no headache, no focal neurologic deficits  Skin: no rash , no wounds Psyche: no complaints    Allergies  Penicillins  Home Medications   Prior to Admission medications   Medication Sig Start Date End Date Taking? Authorizing  Provider  phenazopyridine (PYRIDIUM) 200 MG tablet Take 1 tablet (200 mg total) by mouth 3 (three) times daily. 03/03/14   Sanskriti Greenlaw Marilu Favre, PA-C  traMADol (ULTRAM) 50 MG tablet Take 1 tablet (50 mg total) by mouth every 6 (six) hours as needed. 03/03/14   Angie Hogg Marilu Favre, PA-C   BP 133/78  Pulse 83  Temp(Src) 98.5 F (36.9 C) (Oral)  Resp 18  SpO2 100%  LMP 03/03/2014 Physical Exam  Nursing note and vitals reviewed. Constitutional: She appears well-developed and well-nourished. No distress.  HENT:  Head: Normocephalic and atraumatic.  Eyes: Pupils are equal, round, and reactive to light.  Neck: Normal range of motion. Neck supple.  Cardiovascular: Normal rate and regular rhythm.   Pulmonary/Chest: Effort normal.  Abdominal: Soft. Bowel sounds are normal. She exhibits no distension. There is no tenderness. There is no rigidity, no rebound, no guarding and no CVA tenderness.  Genitourinary: There is bleeding around the vagina. No tenderness around the vagina. No vaginal discharge found.  Neurological: She is alert.  Skin: Skin is warm and dry.    ED Course  Procedures (including critical care time) Labs Review Labs Reviewed  WET PREP, GENITAL - Abnormal; Notable for the following:    WBC, Wet Prep HPF POC FEW (*)    All other components within normal limits  BASIC METABOLIC PANEL -  Abnormal; Notable for the following:    Sodium 135 (*)    Glucose, Bld 159 (*)    GFR calc non Af Amer 86 (*)    All other components within normal limits  CBC - Abnormal; Notable for the following:    MCV 77.9 (*)    MCH 24.7 (*)    Platelets 415 (*)    All other components within normal limits  URINALYSIS, ROUTINE W REFLEX MICROSCOPIC - Abnormal; Notable for the following:    Hgb urine dipstick LARGE (*)    All other components within normal limits  GC/CHLAMYDIA PROBE AMP  URINE MICROSCOPIC-ADD ON  POC URINE PREG, ED    Imaging Review No results found.   EKG Interpretation None       MDM   Final diagnoses:  Pelvic pain in female  Dysmenorrhea    Patient pelvic exam and work-up is negative. Sounds like she is having an abnormal menstrual cycle, will refer back to her PCP and refer to [facility]. They may want to start her on medications to help regulate her period. She reports being at the end of her cycle now.  Rx:  phenazopyridine (PYRIDIUM) 200 MG tablet Take 1 tablet (200 mg total) by mouth 3 (three) times daily. 6 tablet Linus Mako, PA-C traMADol (ULTRAM) 50 MG tablet Take 1 tablet (50 mg total) by mouth every 6 (six) hours as needed. 15 tablet Linus Mako, PA-C   28 y.o.Netta Corrigan Hulse's evaluation in the Emergency Department is complete. It has been determined that no acute conditions requiring further emergency intervention are present at this time. The patient/guardian have been advised of the diagnosis and plan. We have discussed signs and symptoms that warrant return to the ED, such as changes or worsening in symptoms.  Vital signs are stable at discharge. Filed Vitals:   03/03/14 1813  BP: 133/78  Pulse: 83  Temp: 98.5 F (36.9 C)  Resp: 18    Patient/guardian has voiced understanding and agreed to follow-up with the PCP or specialist.      Linus Mako, PA-C 03/03/14 2226

## 2014-03-03 NOTE — Discharge Instructions (Signed)
Pelvic Pain Female pelvic pain can be caused by many different things and start from a variety of places. Pelvic pain refers to pain that is located in the lower half of the abdomen and between your hips. The pain may occur over a short period of time (acute) or may be reoccurring (chronic). The cause of pelvic pain may be related to disorders affecting the female reproductive organs (gynecologic), but it may also be related to the bladder, kidney stones, an intestinal complication, or muscle or skeletal problems. Getting help right away for pelvic pain is important, especially if there has been severe, sharp, or a sudden onset of unusual pain. It is also important to get help right away because some types of pelvic pain can be life threatening.  CAUSES  Below are only some of the causes of pelvic pain. The causes of pelvic pain can be in one of several categories.   Gynecologic.  Pelvic inflammatory disease.  Sexually transmitted infection.  Ovarian cyst or a twisted ovarian ligament (ovarian torsion).  Uterine lining that grows outside the uterus (endometriosis).  Fibroids, cysts, or tumors.  Ovulation.  Pregnancy.  Pregnancy that occurs outside the uterus (ectopic pregnancy).  Miscarriage.  Labor.  Abruption of the placenta or ruptured uterus.  Infection.  Uterine infection (endometritis).  Bladder infection.  Diverticulitis.  Miscarriage related to a uterine infection (septic abortion).  Bladder.  Inflammation of the bladder (cystitis).  Kidney stone(s).  Gastrointestinal.  Constipation.  Diverticulitis.  Neurologic.  Trauma.  Feeling pelvic pain because of mental or emotional causes (psychosomatic).  Cancers of the bowel or pelvis. EVALUATION  Your caregiver will want to take a careful history of your concerns. This includes recent changes in your health, a careful gynecologic history of your periods (menses), and a sexual history. Obtaining your family  history and medical history is also important. Your caregiver may suggest a pelvic exam. A pelvic exam will help identify the location and severity of the pain. It also helps in the evaluation of which organ system may be involved. In order to identify the cause of the pelvic pain and be properly treated, your caregiver may order tests. These tests may include:   A pregnancy test.  Pelvic ultrasonography.  An X-ray exam of the abdomen.  A urinalysis or evaluation of vaginal discharge.  Blood tests. HOME CARE INSTRUCTIONS   Only take over-the-counter or prescription medicines for pain, discomfort, or fever as directed by your caregiver.   Rest as directed by your caregiver.   Eat a balanced diet.   Drink enough fluids to make your urine clear or pale yellow, or as directed.   Avoid sexual intercourse if it causes pain.   Apply warm or cold compresses to the lower abdomen depending on which one helps the pain.   Avoid stressful situations.   Keep a journal of your pelvic pain. Write down when it started, where the pain is located, and if there are things that seem to be associated with the pain, such as food or your menstrual cycle.  Follow up with your caregiver as directed.  SEEK MEDICAL CARE IF:  Your medicine does not help your pain.  You have abnormal vaginal discharge. SEEK IMMEDIATE MEDICAL CARE IF:   You have heavy bleeding from the vagina.   Your pelvic pain increases.   You feel light-headed or faint.   You have chills.   You have pain with urination or blood in your urine.   You have uncontrolled diarrhea   or vomiting.   You have a fever or persistent symptoms for more than 3 days.  You have a fever and your symptoms suddenly get worse.   You are being physically or sexually abused.  MAKE SURE YOU:  Understand these instructions.  Will watch your condition.  Will get help if you are not doing well or get worse. Document Released:  06/05/2004 Document Revised: 11/23/2013 Document Reviewed: 10/29/2011 Exit[employer] Patient Information 2015 [employer]. This information is not intended to replace advice given to you by your health care provider. Make sure you discuss any questions you have with your health care provider.  

## 2014-03-03 NOTE — ED Notes (Signed)
Pt discharged by PA student.

## 2014-03-03 NOTE — ED Notes (Signed)
Per pt, states she has been having flank and pelvic pain on and off for months-states she thinks it might be a UTI-states legs hurt as well

## 2014-03-03 NOTE — ED Provider Notes (Signed)
Medical screening examination/treatment/procedure(s) were performed by non-physician practitioner and as supervising physician I was immediately available for consultation/collaboration.   EKG Interpretation None       Threasa Beards, MD 03/03/14 2242

## 2014-03-04 LAB — GC/CHLAMYDIA PROBE AMP
CT Probe RNA: NEGATIVE
GC Probe RNA: NEGATIVE

## 2014-06-02 ENCOUNTER — Ambulatory Visit: Payer: Self-pay | Admitting: Podiatry

## 2014-06-14 ENCOUNTER — Ambulatory Visit: Payer: Self-pay | Admitting: Podiatry

## 2014-06-23 ENCOUNTER — Ambulatory Visit: Payer: Self-pay | Admitting: Podiatry

## 2014-06-30 ENCOUNTER — Ambulatory Visit: Payer: Self-pay | Admitting: Podiatry

## 2014-09-09 ENCOUNTER — Encounter (HOSPITAL_COMMUNITY): Payer: Self-pay | Admitting: Emergency Medicine

## 2014-09-09 ENCOUNTER — Emergency Department (HOSPITAL_COMMUNITY)
Admission: EM | Admit: 2014-09-09 | Discharge: 2014-09-09 | Disposition: A | Discharge status: Home / Self Care | Payer: BLUE CROSS/BLUE SHIELD | Attending: Emergency Medicine | Admitting: Emergency Medicine

## 2014-09-09 DIAGNOSIS — Z720 Tobacco use: Secondary | ICD-10-CM | POA: Insufficient documentation

## 2014-09-09 DIAGNOSIS — R2243 Localized swelling, mass and lump, lower limb, bilateral: Secondary | ICD-10-CM | POA: Insufficient documentation

## 2014-09-09 DIAGNOSIS — Z8742 Personal history of other diseases of the female genital tract: Secondary | ICD-10-CM | POA: Diagnosis not present

## 2014-09-09 DIAGNOSIS — Z880 Allergy status to penicillin: Secondary | ICD-10-CM | POA: Diagnosis not present

## 2014-09-09 DIAGNOSIS — Z8719 Personal history of other diseases of the digestive system: Secondary | ICD-10-CM | POA: Diagnosis not present

## 2014-09-09 DIAGNOSIS — R609 Edema, unspecified: Secondary | ICD-10-CM

## 2014-09-09 LAB — I-STAT CHEM 8, ED
BUN: 11 mg/dL (ref 6–23)
CALCIUM ION: 1.13 mmol/L (ref 1.12–1.23)
CHLORIDE: 103 mmol/L (ref 96–112)
Creatinine, Ser: 1 mg/dL (ref 0.50–1.10)
Glucose, Bld: 87 mg/dL (ref 70–99)
HEMATOCRIT: 45 % (ref 36.0–46.0)
Hemoglobin: 15.3 g/dL — ABNORMAL HIGH (ref 12.0–15.0)
Potassium: 4.3 mmol/L (ref 3.5–5.1)
Sodium: 138 mmol/L (ref 135–145)
TCO2: 21 mmol/L (ref 0–100)

## 2014-09-09 NOTE — Discharge Instructions (Signed)
It is recommended that you wear compression stockings while at work.

## 2014-09-09 NOTE — ED Notes (Addendum)
Pt reports to ED for bilateral pitting edema in lower extremities, began 3 weeks ago; pt reports beginning a new job Aug 22, 2014 that requires her to be on her feet a lot more than she is used to. Pt also reports more sob than usual but is blaming this on her weight gain. A/o x4.

## 2014-09-09 NOTE — ED Provider Notes (Signed)
CSN: [id number]     Arrival date & time 09/09/14  0810 History   First MD Initiated Contact with Patient 09/09/14 0818     Chief Complaint  Patient presents with  . Leg Swelling     (Consider location/radiation/quality/duration/timing/severity/associated sxs/prior Treatment) HPI Comments: Patient presents today with bilateral swelling of the lower extremities.  Swelling has been present for the past 3 weeks.  Swelling unchanged from onset.  She reports that she started a new job three weeks ago and does a lot of standing.  Swelling improves when she is not working and elevates her legs.  She also reports that she wore three pairs of socks to work one day, which helped with the swelling.  She denies fever, chills, chest pain, cough, hemoptysis, or SOB.  She denies prolonged travel or surgeries in the past 4 weeks.  Denies use of exogenous estrogen.  No history of PE or DVT.  No history of renal disease or CHF.    The history is provided by the patient.    Past Medical History  Diagnosis Date  . Ovarian cyst   . IBS (irritable bowel syndrome)   . GERD (gastroesophageal reflux disease)    Past Surgical History  Procedure Laterality Date  . Hand surgery     History reviewed. No pertinent family history. History  Substance Use Topics  . Smoking status: Current Some Day Smoker  . Smokeless tobacco: Not on file  . Alcohol Use: Yes     Comment: occasionally   OB History    No data available     Review of Systems  All other systems reviewed and are negative.     Allergies  Penicillins  Home Medications   Prior to Admission medications   Medication Sig Start Date End Date Taking? Authorizing Provider  phenazopyridine (PYRIDIUM) 200 MG tablet Take 1 tablet (200 mg total) by mouth 3 (three) times daily. 03/03/14   Tiffany Marilu Favre, PA-C  traMADol (ULTRAM) 50 MG tablet Take 1 tablet (50 mg total) by mouth every 6 (six) hours as needed. 03/03/14   Tiffany Marilu Favre, PA-C   BP  116/81 mmHg  Pulse 86  Temp(Src) 99.1 F (37.3 C) (Oral)  Resp 18  Ht 5\' 7"  (1.702 m)  Wt 286 lb (129.729 kg)  BMI 44.78 kg/m2  SpO2 100%  LMP 08/20/2014 (Exact Date) Physical Exam  Constitutional: She appears well-developed and well-nourished.  HENT:  Head: Normocephalic and atraumatic.  Mouth/Throat: Oropharynx is clear and moist.  Neck: Normal range of motion. Neck supple.  Cardiovascular: Normal rate, regular rhythm and normal heart sounds.   Pulses:      Dorsalis pedis pulses are 2+ on the right side, and 2+ on the left side.  Pulmonary/Chest: Effort normal and breath sounds normal.  Musculoskeletal: Normal range of motion.  1+ pitting edema of LE bilaterally from mid shin distally.  Edema is symmetrical.  No erythema or warmth of LE.  Negative Homan's sign bilaterally  Neurological: She is alert.  Skin: Skin is warm and dry.  Psychiatric: She has a normal mood and affect.  Nursing note and vitals reviewed.   ED Course  Procedures (including critical care time) Labs Review Labs Reviewed - No data to display  Imaging Review No results found.   EKG Interpretation None      MDM   Final diagnoses:  None   Patient presenting with bilateral peripheral edema,which has been present since starting a job 3 week ago that requires  frequent standing.  Swelling is symmetrical.  No erythema or warmth of the LE.  Homan's sign negative bilaterally.  No risk factors for DVT aside from occasional smoking.  Creatine WNL.  History and physical most consistent with dependent peripheral edema.  Patient instructed to wear compression stockings and elevate feet.  Instructed to follow up with PCP.  Return precautions given.    Hyman Bible, PA-C 09/09/14 [place], PA-C 09/09/14 [place] Alvino [place], MD 09/09/14 [phone]

## 2014-11-25 ENCOUNTER — Other Ambulatory Visit (HOSPITAL_COMMUNITY): Payer: Self-pay | Admitting: Internal Medicine

## 2014-11-25 DIAGNOSIS — N182 Chronic kidney disease, stage 2 (mild): Secondary | ICD-10-CM

## 2014-11-29 ENCOUNTER — Ambulatory Visit (HOSPITAL_COMMUNITY)
Admission: RE | Admit: 2014-11-29 | Discharge: 2014-11-29 | Disposition: A | Discharge status: Home / Self Care | Payer: BLUE CROSS/BLUE SHIELD | Source: Ambulatory Visit | Attending: Internal Medicine | Admitting: Internal Medicine

## 2014-11-29 DIAGNOSIS — N189 Chronic kidney disease, unspecified: Secondary | ICD-10-CM | POA: Insufficient documentation

## 2014-11-29 DIAGNOSIS — N182 Chronic kidney disease, stage 2 (mild): Secondary | ICD-10-CM

## 2015-02-25 ENCOUNTER — Ambulatory Visit (INDEPENDENT_AMBULATORY_CARE_PROVIDER_SITE_OTHER): Payer: BLUE CROSS/BLUE SHIELD

## 2015-02-25 ENCOUNTER — Ambulatory Visit (INDEPENDENT_AMBULATORY_CARE_PROVIDER_SITE_OTHER): Payer: BLUE CROSS/BLUE SHIELD | Admitting: Podiatry

## 2015-02-25 ENCOUNTER — Encounter: Payer: Self-pay | Admitting: Podiatry

## 2015-02-25 VITALS — BP 143/90 | HR 90 | Resp 12

## 2015-02-25 DIAGNOSIS — M722 Plantar fascial fibromatosis: Secondary | ICD-10-CM | POA: Diagnosis not present

## 2015-02-25 DIAGNOSIS — M2040 Other hammer toe(s) (acquired), unspecified foot: Secondary | ICD-10-CM | POA: Diagnosis not present

## 2015-02-25 MED ORDER — TRIAMCINOLONE ACETONIDE 10 MG/ML IJ SUSP
10.0000 mg | Freq: Once | INTRAMUSCULAR | Status: AC
  Administered 2015-02-25: 10 mg

## 2015-02-25 NOTE — Progress Notes (Signed)
Subjective:    Patient ID: [name], female    DOB: [birth year], 29 y.o.   MRN: [id number]  HPI 29 year old female presents the office today with concerns of bilateral hammertoe contractures of the left side more symptomatic than the right. She states that she has pain on a consistent basis daily despite shoe gear changes and offloading and padding. She gets irritation over the toes from shoe gear. Just a states that she has pain to the left heel which has been ongoing for approximate one month which is worse in the morning which is relieved by ambulation. She denies any history of injury or trauma. Denies any swelling or redness. No tingling or numbness. The pain does not wake her up at night. No other complaints at this time.  Review of Systems  Gastrointestinal: Positive for nausea, abdominal pain, diarrhea and constipation.  Musculoskeletal: Positive for myalgias and back pain.  Skin: Positive for rash.  Psychiatric/Behavioral: The patient is nervous/anxious.   All other systems reviewed and are negative.      Objective:   Physical Exam AAO x3, NAD DP/PT pulses palpable bilaterally, CRT less than 3 seconds Protective sensation intact with Simms Weinstein monofilament, vibratory sensation intact, Achilles tendon reflex intact Tenderness to palpation overlying the plantar medial tubercle of the calcaneus to the left heel at the insertion of the plantar fascia. There is no pain along the course of plantar fascia within the arch of the foot. There is no pain with lateral compression of the calcaneus or pain the vibratory sensation. No pain on the posterior aspect of the calcaneus or along the course/insertion of the Achilles tendon. There is semirigid hammertoe contractures bilateral second digits. On the left foot there is also hammertoe contractures of fourth and fifth digit. There is tenderness to palpation overlying the left second, fourth, fifth digit as well as the right second  digit. The left is more symptomatic than the right. Slight irritation over the dorsal PIPJ from shoe gear with tenderness over this area. There is no overlying edema, erythema, increase in warmth.  No other areas of tenderness palpation or pain with vibratory sensation to the foot/ankle. MMT 5/5, ROM WNL No open lesions or pre-ulcerative lesions are identified. No pain with calf compression, swelling, warmth, erythema.     Assessment & Plan:  29 year old female with left heel pain, plantar fasciitis; bilateral hammertoe contractures left greater than right -X-rays were obtained and reviewed with the patient.  -Treatment options discussed including all alternatives, risks, and complications  1. Hammertoes -I discussed both conservative and surgical treatment options. This time she is attended multiple conservative treatments without any relief of symptoms. At this time she is question surgical intervention on the left foot. -I discussed with her hammertoe repair the second, fourth, fifth digits. She agrees to this and wishes to proceed. The incision placement as well as the postoperative course was discussed with the patient. I discussed risks of the surgery which include, but not limited to, infection, bleeding, pain, swelling, need for further surgery, delayed or nonhealing, painful or ugly scar, numbness or sensation changes, over/under correction, recurrence, transfer lesions, further deformity, hardware failure, DVT/PE, loss of toe/foot. Patient understands these risks and wishes to proceed with surgery. The surgical consent was reviewed with the patient all 3 pages were signed. No promises or guarantees were given to the outcome of the procedure. All questions were answered to the best of my ability. Before the surgery the patient was encouraged to call the  office if there is any further questions. The surgery will be performed at the [facility] on an outpatient basis. -Will need clearance from  nephrology/PCP prior to surgery  2. Left heel pain; likely plantar fasciitis -Patient elects to proceed with steroid injection into the left heel. Under sterile skin preparation, a total of 2.5cc of kenalog 10, 0.5% Marcaine plain, and 2% lidocaine plain were infiltrated into the symptomatic area without complication. A band-aid was applied. Patient tolerated the injection well without complication. Post-injection care with discussed with the patient. Discussed with the patient to ice the area over the next couple of days to help prevent a steroid flare.  -Dispensed plantar fascial brace -Ice and stretching activities consistently on a daily basis -Shoe gear modifications. Recommended not to go barefoot even [place]. -Discussed orthotics  -Follow-up 3 weeks or sooner if any problems arise. In the meantime, encouraged to call the office with any questions, concerns, change in symptoms.   Celesta Gentile, DPM

## 2015-02-25 NOTE — Patient Instructions (Addendum)
Pre-Operative Instructions  Congratulations, you have decided to take an important step to improving your quality of life.  You can be assured that the doctors of [facility] will be with you every step of the way.   Plan to be at the surgery center/hospital at least 1 (one) hour prior to your scheduled time unless otherwise directed by the surgical center/hospital staff.  You must have a responsible adult accompany you, remain during the surgery and drive you home.  Make sure you have directions to the surgical center/hospital and know how to get there on time.  For hospital based surgery you will need to obtain a history and physical form from your family physician within 1 month prior to the date of surgery- we will give you a form for you primary physician.   We make every effort to accommodate the date you request for surgery.  There are however, times where surgery dates or times have to be moved.  We will contact you as soon as possible if a change in schedule is required.    No Aspirin/Ibuprofen for one week before surgery.  If you are on aspirin, any non-steroidal anti-inflammatory medications (Mobic, Aleve, Ibuprofen) you should stop taking it 7 days prior to your surgery.  You make take Tylenol  For pain prior to surgery.   Medications- If you are taking daily heart and blood pressure medications, seizure, reflux, allergy, asthma, anxiety, pain or diabetes medications, make sure the surgery center/hospital is aware before the day of surgery so they may notify you which medications to take or avoid the day of surgery.  No food or drink after midnight the night before surgery unless directed otherwise by surgical center/hospital staff.  No alcoholic beverages 24 hours prior to surgery.  No smoking 24 hours prior to or 24 hours after surgery.  Wear loose pants or shorts- loose enough to fit over bandages, boots, and casts.  No slip on shoes, sneakers are best.  Bring your boot  with you to the surgery center/hospital.  Also bring crutches or a walker if your physician has prescribed it for you.  If you do not have this equipment, it will be provided for you after surgery.  If you have not been contracted by the surgery center/hospital by the day before your surgery, call to confirm the date and time of your surgery.  Leave-time from work may vary depending on the type of surgery you have.  Appropriate arrangements should be made prior to surgery with your employer.  Prescriptions will be provided immediately following surgery by your doctor.  Have these filled as soon as possible after surgery and take the medication as directed.  Remove nail polish on the operative foot.  Wash the night before surgery.  The night before surgery wash the foot and leg well with the antibacterial soap provided and water paying special attention to beneath the toenails and in between the toes.  Rinse thoroughly with water and dry well with a towel.  Perform this wash unless told not to do so by your physician.  Enclosed: 1 Ice pack (please put in freezer the night before surgery)   1 Hibiclens skin cleaner   Pre-op Instructions  If you have any questions regarding the instructions, do not hesitate to call our office.  [place]: [place], [place] [address] [place]: [address], [place], [place] [address] [phone]  [place]: [place], [place] [address] [phone]  Dr. Delfino Lovett  Tuchman DPM, Dr. Ila Mcgill DPM Dr. Harriet Masson DPM, Dr. Lanelle Bal DPM, Dr. Trudie Buckler DPM, Dr. Celesta Gentile DPM  Plantar Fasciitis (Heel Spur Syndrome) with Rehab The plantar fascia is a fibrous, ligament-like, soft-tissue structure that spans the bottom of the foot. Plantar fasciitis is a condition that causes pain in the foot due to inflammation of the tissue. SYMPTOMS   Pain and tenderness on the underneath side of the foot.  Pain that  worsens with standing or walking. CAUSES  Plantar fasciitis is caused by irritation and injury to the plantar fascia on the underneath side of the foot. Common mechanisms of injury include:  Direct trauma to bottom of the foot.  Damage to a small nerve that runs under the foot where the main fascia attaches to the heel bone.  Stress placed on the plantar fascia due to bone spurs. RISK INCREASES WITH:   Activities that place stress on the plantar fascia (running, jumping, pivoting, or cutting).  Poor strength and flexibility.  Improperly fitted shoes.  Tight calf muscles.  Flat feet.  Failure to warm-up properly before activity.  Obesity. PREVENTION  Warm up and stretch properly before activity.  Allow for adequate recovery between workouts.  Maintain physical fitness:  Strength, flexibility, and endurance.  Cardiovascular fitness.  Maintain a health body weight.  Avoid stress on the plantar fascia.  Wear properly fitted shoes, including arch supports for individuals who have flat feet. PROGNOSIS  If treated properly, then the symptoms of plantar fasciitis usually resolve without surgery. However, occasionally surgery is necessary. RELATED COMPLICATIONS   Recurrent symptoms that may result in a chronic condition.  Problems of the lower back that are caused by compensating for the injury, such as limping.  Pain or weakness of the foot during push-off following surgery.  Chronic inflammation, scarring, and partial or complete fascia tear, occurring more often from repeated injections. TREATMENT  Treatment initially involves the use of ice and medication to help reduce pain and inflammation. The use of strengthening and stretching exercises may help reduce pain with activity, especially stretches of the Achilles tendon. These exercises may be performed at home or with a therapist. Your caregiver may recommend that you use heel cups of arch supports to help reduce  stress on the plantar fascia. Occasionally, corticosteroid injections are given to reduce inflammation. If symptoms persist for greater than 6 months despite non-surgical (conservative), then surgery may be recommended.  MEDICATION   If pain medication is necessary, then nonsteroidal anti-inflammatory medications, such as aspirin and ibuprofen, or other minor pain relievers, such as acetaminophen, are often recommended.  Do not take pain medication within 7 days before surgery.  Prescription pain relievers may be given if deemed necessary by your caregiver. Use only as directed and only as much as you need.  Corticosteroid injections may be given by your caregiver. These injections should be reserved for the most serious cases, because they may only be given a certain number of times. HEAT AND COLD  Cold treatment (icing) relieves pain and reduces inflammation. Cold treatment should be applied for 10 to 15 minutes every 2 to 3 hours for inflammation and pain and immediately after any activity that aggravates your symptoms. Use ice packs or massage the area with a piece of ice (ice massage).  Heat treatment may be used prior to performing the stretching and strengthening activities prescribed by your caregiver, physical therapist, or athletic trainer. Use a heat pack or soak the injury in warm water.  SEEK IMMEDIATE MEDICAL CARE IF:  Treatment seems to offer no benefit, or the condition worsens.  Any medications produce adverse side effects. EXERCISES RANGE OF MOTION (ROM) AND STRETCHING EXERCISES - Plantar Fasciitis (Heel Spur Syndrome) These exercises may help you when beginning to rehabilitate your injury. Your symptoms may resolve with or without further involvement from your physician, physical therapist or athletic trainer. While completing these exercises, remember:   Restoring tissue flexibility helps normal motion to return to the joints. This allows healthier, less painful movement  and activity.  An effective stretch should be held for at least 30 seconds.  A stretch should never be painful. You should only feel a gentle lengthening or release in the stretched tissue. RANGE OF MOTION - Toe Extension, Flexion  Sit with your right / left leg crossed over your opposite knee.  Grasp your toes and gently pull them back toward the top of your foot. You should feel a stretch on the bottom of your toes and/or foot.  Hold this stretch for __________ seconds.  Now, gently pull your toes toward the bottom of your foot. You should feel a stretch on the top of your toes and or foot.  Hold this stretch for __________ seconds. Repeat __________ times. Complete this stretch __________ times per day.  RANGE OF MOTION - Ankle Dorsiflexion, Active Assisted  Remove shoes and sit on a chair that is preferably not on a carpeted surface.  Place right / left foot under knee. Extend your opposite leg for support.  Keeping your heel down, slide your right / left foot back toward the chair until you feel a stretch at your ankle or calf. If you do not feel a stretch, slide your bottom forward to the edge of the chair, while still keeping your heel down.  Hold this stretch for __________ seconds. Repeat __________ times. Complete this stretch __________ times per day.  STRETCH - Gastroc, Standing  Place hands on wall.  Extend right / left leg, keeping the front knee somewhat bent.  Slightly point your toes inward on your back foot.  Keeping your right / left heel on the floor and your knee straight, shift your weight toward the wall, not allowing your back to arch.  You should feel a gentle stretch in the right / left calf. Hold this position for __________ seconds. Repeat __________ times. Complete this stretch __________ times per day. STRETCH - Soleus, Standing  Place hands on wall.  Extend right / left leg, keeping the other knee somewhat bent.  Slightly point your toes  inward on your back foot.  Keep your right / left heel on the floor, bend your back knee, and slightly shift your weight over the back leg so that you feel a gentle stretch deep in your back calf.  Hold this position for __________ seconds. Repeat __________ times. Complete this stretch __________ times per day. STRETCH - Gastrocsoleus, Standing  Note: This exercise can place a lot of stress on your foot and ankle. Please complete this exercise only if specifically instructed by your caregiver.   Place the ball of your right / left foot on a step, keeping your other foot firmly on the same step.  Hold on to the wall or a rail for balance.  Slowly lift your other foot, allowing your body weight to press your heel down over the edge of the step.  You should feel a stretch in your right / left calf.  Hold this position for __________  seconds.  Repeat this exercise with a slight bend in your right / left knee. Repeat __________ times. Complete this stretch __________ times per day.  STRENGTHENING EXERCISES - Plantar Fasciitis (Heel Spur Syndrome)  These exercises may help you when beginning to rehabilitate your injury. They may resolve your symptoms with or without further involvement from your physician, physical therapist or athletic trainer. While completing these exercises, remember:   Muscles can gain both the endurance and the strength needed for everyday activities through controlled exercises.  Complete these exercises as instructed by your physician, physical therapist or athletic trainer. Progress the resistance and repetitions only as guided. STRENGTH - Towel Curls  Sit in a chair positioned on a non-carpeted surface.  Place your foot on a towel, keeping your heel on the floor.  Pull the towel toward your heel by only curling your toes. Keep your heel on the floor.  If instructed by your physician, physical therapist or athletic trainer, add ____________________ at the end  of the towel. Repeat __________ times. Complete this exercise __________ times per day. STRENGTH - Ankle Inversion  Secure one end of a rubber exercise band/tubing to a fixed object (table, pole). Loop the other end around your foot just before your toes.  Place your fists between your knees. This will focus your strengthening at your ankle.  Slowly, pull your big toe up and in, making sure the band/tubing is positioned to resist the entire motion.  Hold this position for __________ seconds.  Have your muscles resist the band/tubing as it slowly pulls your foot back to the starting position. Repeat __________ times. Complete this exercises __________ times per day.  Document Released: 07/09/2005 Document Revised: 10/01/2011 Document Reviewed: 10/21/2008 [facility] Patient Information 2015 [place], [place]. This information is not intended to replace advice given to you by your health care provider. Make sure you discuss any questions you have with your health care provider.

## 2015-02-28 DIAGNOSIS — M722 Plantar fascial fibromatosis: Secondary | ICD-10-CM | POA: Insufficient documentation

## 2015-02-28 DIAGNOSIS — M2040 Other hammer toe(s) (acquired), unspecified foot: Secondary | ICD-10-CM | POA: Insufficient documentation

## 2015-04-05 ENCOUNTER — Telehealth: Payer: Self-pay | Admitting: *Deleted

## 2015-04-05 NOTE — Telephone Encounter (Signed)
"  I'm not sure if I'm on the correct line but I wanted to call and cancel surgery I have on for tomorrow."  I left patient a message that I got her message regarding surgery.  Call if you have any questions or concerns.  Patient wasn't scheduled for surgery.

## 2015-04-06 ENCOUNTER — Emergency Department (HOSPITAL_COMMUNITY)
Admission: EM | Admit: 2015-04-06 | Discharge: 2015-04-06 | Discharge status: Against Medical Advice | Payer: BLUE CROSS/BLUE SHIELD | Attending: Emergency Medicine | Admitting: Emergency Medicine

## 2015-04-06 ENCOUNTER — Encounter (HOSPITAL_COMMUNITY): Payer: Self-pay | Admitting: Emergency Medicine

## 2015-04-06 DIAGNOSIS — G43909 Migraine, unspecified, not intractable, without status migrainosus: Secondary | ICD-10-CM | POA: Insufficient documentation

## 2015-04-06 DIAGNOSIS — Z720 Tobacco use: Secondary | ICD-10-CM | POA: Insufficient documentation

## 2015-04-06 DIAGNOSIS — R51 Headache: Secondary | ICD-10-CM | POA: Diagnosis present

## 2015-04-06 HISTORY — DX: Disorder of kidney and ureter, unspecified: N28.9

## 2015-04-06 HISTORY — DX: Migraine, unspecified, not intractable, without status migrainosus: G43.909

## 2015-04-06 NOTE — ED Notes (Signed)
Pt states with this headache her pain is all over but the left side of her face feels numb  Speech is clear and no facial droop is noted

## 2015-04-06 NOTE — ED Notes (Signed)
Pt states for the past month she has been having really bad headaches  Pt states she has had migraines since she was young and has been seeing a neurologist for her migraines  Pt states for the past month the headaches have been worse  Pt states with some of them she gets slurred speech, difficulty with her memory

## 2015-04-07 ENCOUNTER — Emergency Department (HOSPITAL_COMMUNITY)
Admission: EM | Admit: 2015-04-07 | Discharge: 2015-04-07 | Disposition: A | Discharge status: Home / Self Care | Payer: BLUE CROSS/BLUE SHIELD | Attending: Emergency Medicine | Admitting: Emergency Medicine

## 2015-04-07 ENCOUNTER — Encounter (HOSPITAL_COMMUNITY): Payer: Self-pay

## 2015-04-07 DIAGNOSIS — R519 Headache, unspecified: Secondary | ICD-10-CM

## 2015-04-07 DIAGNOSIS — E669 Obesity, unspecified: Secondary | ICD-10-CM | POA: Insufficient documentation

## 2015-04-07 DIAGNOSIS — Z880 Allergy status to penicillin: Secondary | ICD-10-CM | POA: Diagnosis not present

## 2015-04-07 DIAGNOSIS — Z8679 Personal history of other diseases of the circulatory system: Secondary | ICD-10-CM | POA: Diagnosis not present

## 2015-04-07 DIAGNOSIS — Z8742 Personal history of other diseases of the female genital tract: Secondary | ICD-10-CM | POA: Diagnosis not present

## 2015-04-07 DIAGNOSIS — Z720 Tobacco use: Secondary | ICD-10-CM | POA: Insufficient documentation

## 2015-04-07 DIAGNOSIS — R51 Headache: Secondary | ICD-10-CM | POA: Diagnosis present

## 2015-04-07 DIAGNOSIS — Z8719 Personal history of other diseases of the digestive system: Secondary | ICD-10-CM | POA: Insufficient documentation

## 2015-04-07 DIAGNOSIS — Z87448 Personal history of other diseases of urinary system: Secondary | ICD-10-CM | POA: Insufficient documentation

## 2015-04-07 MED ORDER — BUTALBITAL-APAP-CAFFEINE 50-325-40 MG PO TABS: 1.0000 | ORAL_TABLET | Freq: Four times a day (QID) | ORAL | Status: DC | PRN

## 2015-04-07 NOTE — ED Notes (Signed)
Bed: WHALF Expected date:  Expected time:  Means of arrival:  Comments: 

## 2015-04-07 NOTE — ED Notes (Signed)
Bed: WA21 Expected date:  Expected time:  Means of arrival:  Comments: No monitor

## 2015-04-07 NOTE — ED Notes (Signed)
Pt c/o headache x 1 month.  Pain score 8/10.  Pt has not taken anything for the pain.  Pt reports Hx of migraines, but sts "it's never been this bad."  Pt is not followed by a neurologist.  +lightsensitivity +Blurred vison

## 2015-04-07 NOTE — ED Notes (Signed)
Pt given discharge. Pt has relief and wants to go. Pt alert x3.

## 2015-04-07 NOTE — ED Provider Notes (Signed)
CSN: [id number]     Arrival date & time 04/07/15  1431 History   First MD Initiated Contact with Patient 04/07/15 1648     Chief Complaint  Patient presents with  . Headache     (Consider location/radiation/quality/duration/timing/severity/associated sxs/prior Treatment) HPI   29 year old female with history of recurrent migraine headache, IBS, ovarian cyst, kidney disease presenting for evaluation of headache. Patient reports for the past month she has had recurrent headache. 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  For the past several weeks she described the headache as throbbing unilateral affecting the left side of face and have been more intense. Patient mentioned that she was diagnosed with having migraine when she was a child but has not been seen by a neurologist for many years. She denies having access fever, chills, URI symptoms, diplopia, scintillating scotoma, neck stiffness, focal numbness weakness, or rash. She is here due to the prolonged duration of her symptoms. Her last menstrual period was 09/03. She rates the headache as 7 out of 10 at this time.   Past Medical History  Diagnosis Date  . Ovarian cyst   . IBS (irritable bowel syndrome)   . GERD (gastroesophageal reflux disease)   . Migraine   . Kidney disease    Past Surgical History  Procedure Laterality Date  . Hand surgery     Family History  Problem Relation Age of Onset  . Diabetes Other    Social History  Substance Use Topics  . Smoking status: Current Some Day Smoker  . Smokeless tobacco: None  . Alcohol Use: Yes     Comment: occasionally   OB History    No data available     Review of Systems  All other systems reviewed and are negative.     Allergies  Penicillins  Home Medications   Prior to Admission medications    Medication Sig Start Date End Date Taking? Authorizing Provider  BIOTIN PO Take 1 tablet by mouth daily.   Yes Historical Provider, MD  furosemide (LASIX) 20 MG tablet Take 20 mg by mouth as needed for fluid.  02/11/15  Yes Historical Provider, MD  HYDROcodone-acetaminophen (NORCO/VICODIN) 5-325 MG per tablet Take 1 tablet by mouth every 6 (six) hours as needed. for pain 12/08/14  Yes Historical Provider, MD  losartan (COZAAR) 25 MG tablet Take 25 mg by mouth daily. 02/11/15  Yes Historical Provider, MD  phenazopyridine (PYRIDIUM) 200 MG tablet Take 1 tablet (200 mg total) by mouth 3 (three) times daily. Patient not taking: Reported on 04/07/2015 03/03/14   Delos Haring, PA-C   BP 127/74 mmHg  Pulse 86  Temp(Src) 98.2 F (36.8 C) (Oral)  Resp 16  SpO2 98%  LMP 03/26/2015 (Exact Date) Physical Exam  Constitutional: She is oriented to person, place, and time. She appears well-developed and well-nourished. No distress.  Obese African-American female appears to be in no acute distress, nontoxic  HENT:  Head: Atraumatic.  Eyes: Conjunctivae are normal.  Neck: Neck supple.  No nuchal rigidity.  Cardiovascular: Normal rate and regular rhythm.   Pulmonary/Chest: Effort normal and breath sounds normal.  Abdominal: Soft. There is no tenderness.  Neurological: She is alert and oriented to person, place, and time.  Neurologic exam:  Speech clear, pupils equal round reactive to light, extraocular movements intact  Normal peripheral visual fields Cranial  nerves III through XII normal including no facial droop Follows commands, moves all extremities x4, normal strength to bilateral upper and lower extremities at all major muscle groups including grip Sensation normal to light touch  Coordination intact, no limb ataxia, finger-nose-finger normal Rapid alternating movements normal No pronator drift Gait normal   Skin: No rash noted.  Psychiatric: She has a normal mood and affect.  Nursing note  and vitals reviewed.   ED Course  Procedures (including critical care time)  5:17 PM Headache similar to previous, no fever, neck stiffness, neuro findings or new symptoms to suggest more serious etiology.  I don't think SAH, ICH, meningitis, encephalitis, mass at this time.  No recent trauma.  I don't feel imaging necessary at this time.  Plan to control symptoms. Patient however requesting for headache medication to go home and would like to follow-up with a neurologist. Patient will be given fioricet prescription and outpatient follow-up. Return precautions discussed. Pt does not want migraine cocktail at this time.     MDM   Final diagnoses:  Recurrent headache    BP 127/74 mmHg  Pulse 86  Temp(Src) 98.2 F (36.8 C) (Oral)  Resp 16  SpO2 98%  LMP 03/26/2015 (Exact Date)     Domenic Moras, PA-C 04/07/15 [place], MD 04/07/15 2256

## 2015-04-07 NOTE — Discharge Instructions (Signed)

## 2015-04-14 ENCOUNTER — Encounter: Payer: Self-pay | Admitting: Neurology

## 2015-04-14 ENCOUNTER — Ambulatory Visit (INDEPENDENT_AMBULATORY_CARE_PROVIDER_SITE_OTHER): Payer: BLUE CROSS/BLUE SHIELD | Admitting: Neurology

## 2015-04-14 VITALS — BP 124/79 | HR 88 | Ht 67.0 in | Wt 292.5 lb

## 2015-04-14 DIAGNOSIS — G43019 Migraine without aura, intractable, without status migrainosus: Secondary | ICD-10-CM

## 2015-04-14 DIAGNOSIS — G43009 Migraine without aura, not intractable, without status migrainosus: Secondary | ICD-10-CM | POA: Insufficient documentation

## 2015-04-14 HISTORY — DX: Migraine without aura, not intractable, without status migrainosus: G43.009

## 2015-04-14 MED ORDER — PREDNISONE 5 MG PO TABS: ORAL_TABLET | ORAL | Status: DC

## 2015-04-14 MED ORDER — TOPIRAMATE 25 MG PO TABS: ORAL_TABLET | ORAL | Status: DC

## 2015-04-14 NOTE — Patient Instructions (Addendum)
We will check MRI evaluation of the brain, I will call with results. Will start a medication called Topamax to take daily to help prevent the headache, I will give you a trial on his own briefly to see if we can knock out the headache.  Topamax (topiramate) is a seizure medication that has an FDA approval for seizures and for migraine headache. Potential side effects of this medication include weight loss, cognitive slowing, tingling in the fingers and toes, and carbonated drinks will taste bad. If any significant side effects are noted on this drug, please contact our office.  Migraine Headache A migraine headache is an intense, throbbing pain on one or both sides of your head. A migraine can last for 30 minutes to several hours. CAUSES  The exact cause of a migraine headache is not always known. However, a migraine may be caused when nerves in the brain become irritated and release chemicals that cause inflammation. This causes pain. Certain things may also trigger migraines, such as:  Alcohol.  Smoking.  Stress.  Menstruation.  Aged cheeses.  Foods or drinks that contain nitrates, glutamate, aspartame, or tyramine.  Lack of sleep.  Chocolate.  Caffeine.  Hunger.  Physical exertion.  Fatigue.  Medicines used to treat chest pain (nitroglycerine), birth control pills, estrogen, and some blood pressure medicines. SIGNS AND SYMPTOMS  Pain on one or both sides of your head.  Pulsating or throbbing pain.  Severe pain that prevents daily activities.  Pain that is aggravated by any physical activity.  Nausea, vomiting, or both.  Dizziness.  Pain with exposure to bright lights, loud noises, or activity.  General sensitivity to bright lights, loud noises, or smells. Before you get a migraine, you may get warning signs that a migraine is coming (aura). An aura may include:  Seeing flashing lights.  Seeing bright spots, halos, or zigzag lines.  Having tunnel vision  or blurred vision.  Having feelings of numbness or tingling.  Having trouble talking.  Having muscle weakness. DIAGNOSIS  A migraine headache is often diagnosed based on:  Symptoms.  Physical exam.  A CT scan or MRI of your head. These imaging tests cannot diagnose migraines, but they can help rule out other causes of headaches. TREATMENT Medicines may be given for pain and nausea. Medicines can also be given to help prevent recurrent migraines.  HOME CARE INSTRUCTIONS  Only take over-the-counter or prescription medicines for pain or discomfort as directed by your health care [name]. The use of long-term narcotics is not recommended.  Lie down in a dark, quiet room when you have a migraine.  Keep a journal to find out what may trigger your migraine headaches. For example, write down:  What you eat and drink.  How much sleep you get.  Any change to your diet or medicines.  Limit alcohol consumption.  Quit smoking if you smoke.  Get 7-9 hours of sleep, or as recommended by your health care [name].  Limit stress.  Keep lights dim if bright lights bother you and make your migraines worse. SEEK IMMEDIATE MEDICAL CARE IF:   Your migraine becomes severe.  You have a fever.  You have a stiff neck.  You have vision loss.  You have muscular weakness or loss of muscle control.  You start losing your balance or have trouble walking.  You feel faint or pass out.  You have severe symptoms that are different from your first symptoms. MAKE SURE YOU:   Understand these instructions.  Will  watch your condition.  Will get help right away if you are not doing well or get worse. Document Released: 07/09/2005 Document Revised: 11/23/2013 Document Reviewed: 03/16/2013 [facility] Patient Information 2015 [place], [place]. This information is not intended to replace advice given to you by your health care [name]. Make sure you discuss any questions you have with your health  care [name].

## 2015-04-14 NOTE — Progress Notes (Signed)
Reason for visit: Headache  Referring physician: [place]  [name] is a 29 y.o. female  History of present illness:  [name] is a 29 year old right-handed black female with a history of migraine headaches since around age 3 or 30. The patient generally will have relatively infrequent headaches occurring once or twice a month and the headaches are usually bifrontal in nature associated with some left facial numbness and some difficulty with concentration. In general, she will take over-the-counter medications for headache with good benefit. One month ago, the patient noted that the headaches converted to be daily. The headaches are now throughout the entire head with sharp pains in the top of the head and some neck discomfort as well. She is having increased numbness on the left side the face with a feeling of being "spacey". The patient denies any numbness or weakness on the arms or legs. She denies any significant balance issues or falls. She was given Fioricet to take for the headaches, but this actually worsened the headache pain. The patient denies any recent weight gain. She eyes that she is on birth control pills. In general, stress will bring on her headache. She does have some floaters and some blurring of vision. She denies nausea or vomiting. She comes to this office for an evaluation.  Past Medical History  Diagnosis Date  . Ovarian cyst   . IBS (irritable bowel syndrome)   . GERD (gastroesophageal reflux disease)   . Migraine   . Kidney disease   . Common migraine 04/14/2015  . Morbidly obese     Past Surgical History  Procedure Laterality Date  . Hand surgery Right     ORIF    Family History  Problem Relation Age of Onset  . Diabetes Other   . Migraines Mother   . Diabetes Mother   . Diabetes Father   . Migraines Maternal Aunt   . Migraines Maternal Grandmother   . Diabetes Maternal Grandmother     Social history:  reports that she has been smoking.   She has never used smokeless tobacco. She reports that she does not drink alcohol or use illicit drugs.  Medications:  Prior to Admission medications   Medication Sig Start Date End Date Taking? Authorizing Provider  BIOTIN PO Take 1 tablet by mouth daily.   Yes Historical Provider, MD  furosemide (LASIX) 20 MG tablet Take 20 mg by mouth as needed for fluid.  02/11/15  Yes Historical Provider, MD  HYDROcodone-acetaminophen (NORCO/VICODIN) 5-325 MG per tablet Take 1 tablet by mouth every 6 (six) hours as needed. for pain 12/08/14  Yes Historical Provider, MD  losartan (COZAAR) 25 MG tablet Take 25 mg by mouth daily. 02/11/15  Yes Historical Provider, MD  phenazopyridine (PYRIDIUM) 200 MG tablet Take 1 tablet (200 mg total) by mouth 3 (three) times daily. 03/03/14  Yes Savannah Haring, PA-C      Allergies  Allergen Reactions  . Penicillins Hives    Has patient had a PCN reaction causing immediate rash, facial/tongue/throat swelling, SOB or lightheadedness with hypotension: yes Has patient had a PCN reaction causing severe rash involving mucus membranes or skin necrosis: No Has patient had a PCN reaction that required hospitalization no Has patient had a PCN reaction occurring within the last 10 years: Yes If all of the above answers are "NO", then may proceed with Cephalosporin use.     ROS:  Out of a complete 14 system review of symptoms, the patient complains  only of the following symptoms, and all other reviewed systems are negative.  Fatigue Chest pain swelling the legs Spinning sensations Blurred vision, double vision Shortness of breath, cough Diarrhea, constipation Increased thirst Cramps, aching muscles Memory loss, confusion, headache, numbness, slurred speech, dizziness Depression, anxiety, decreased energy, change in appetite, suicidal thoughts, racing thoughts Insomnia  Blood pressure 124/79, pulse 88, height 5\' 7"  (1.702 m), weight 292 lb 8 oz (132.677 kg), last  menstrual period 03/26/2015.  Physical Exam  General: The patient is alert and cooperative at the time of the examination. The patient is markedly obese.  Eyes: Pupils are equal, round, and reactive to light. Discs are flat bilaterally.  Neck: The neck is supple, no carotid bruits are noted.  Respiratory: The respiratory examination is clear.  Cardiovascular: The cardiovascular examination reveals a regular rate and rhythm, no obvious murmurs or rubs are noted.  Skin: Extremities are without significant edema.  Neurologic Exam  Mental status: The patient is alert and oriented x 3 at the time of the examination. The patient has apparent normal recent and remote memory, with an apparently normal attention span and concentration ability.  Cranial nerves: Facial symmetry is present. There is good sensation of the face to pinprick and soft touch bilaterally. The strength of the facial muscles and the muscles to head turning and shoulder shrug are normal bilaterally. Speech is well enunciated, no aphasia or dysarthria is noted. Extraocular movements are full. Visual fields are full. The tongue is midline, and the patient has symmetric elevation of the soft palate. No obvious hearing deficits are noted.  Motor: The motor testing reveals 5 over 5 strength of all 4 extremities. Good symmetric motor tone is noted throughout.  Sensory: Sensory testing is intact to pinprick, soft touch, vibration sensation, and position sense on all 4 extremities. No evidence of extinction is noted.  Coordination: Cerebellar testing reveals good finger-nose-finger and heel-to-shin bilaterally.  Gait and station: Gait is normal. Tandem gait is normal. Romberg is negative. No drift is seen.  Reflexes: Deep tendon reflexes are symmetric and normal bilaterally. Toes are downgoing bilaterally.   Assessment/Plan:  1. Common migraine, converted migraine  2. Obesity  The patient has an unremarkable examination.  The patient has had very frequent headaches recently. The patient is reporting some increased numbness of the left face. She will be sent for MRI of the brain. We will give her a prednisone Dosepak, 5 mg 6 day pack. She will be started on Topamax, and follow-up in 3 months. There is no evidence of papilledema, although pseudotumor does still need to be considered in the future if she does not respond to therapy.  Jill Alexanders MD 04/14/2015 6:51 PM  [facility] [address] [place], [place] [address]  Phone [phone] Fax [phone]

## 2015-04-21 ENCOUNTER — Telehealth: Payer: Self-pay | Admitting: Neurology

## 2015-04-21 NOTE — Telephone Encounter (Signed)
Pt called stating she feels topiramate (TOPAMAX) 25 MG tablet is not working. HA has increased in intensity and pressure progressing daily since last Thursday. She has not started predniSONE (DELTASONE) 5 MG tablet . Please call and advise. Patient can be reached at [phone].

## 2015-04-21 NOTE — Telephone Encounter (Signed)
I called the patient. I advised she start the Prednisone taper Dr. Jannifer Franklin prescribed and give the Topamax more time to work. I advised she call back if the Prednisone does not work for her. She verbalized understanding.

## 2015-04-26 ENCOUNTER — Ambulatory Visit
Admission: RE | Admit: 2015-04-26 | Discharge: 2015-04-26 | Disposition: A | Discharge status: Home / Self Care | Payer: BLUE CROSS/BLUE SHIELD | Source: Ambulatory Visit | Attending: Neurology | Admitting: Neurology

## 2015-04-26 DIAGNOSIS — G43019 Migraine without aura, intractable, without status migrainosus: Secondary | ICD-10-CM | POA: Diagnosis not present

## 2015-04-28 ENCOUNTER — Telehealth: Payer: Self-pay | Admitting: Neurology

## 2015-04-28 NOTE — Telephone Encounter (Signed)
I called the patient. The MRI of the brain is normal.  MRI brain 04/28/15:  IMPRESSION:  Normal MRI brain (without).

## 2015-08-05 ENCOUNTER — Ambulatory Visit: Payer: BLUE CROSS/BLUE SHIELD | Admitting: Neurology

## 2015-08-05 ENCOUNTER — Telehealth: Payer: Self-pay

## 2015-08-05 NOTE — Telephone Encounter (Signed)
Patient did not come to a f/u appointment today.  

## 2015-08-08 ENCOUNTER — Encounter: Payer: Self-pay | Admitting: Neurology

## 2015-08-17 ENCOUNTER — Encounter (HOSPITAL_COMMUNITY): Payer: Self-pay | Admitting: *Deleted

## 2015-08-17 ENCOUNTER — Emergency Department (HOSPITAL_COMMUNITY)
Admission: EM | Admit: 2015-08-17 | Discharge: 2015-08-17 | Disposition: A | Discharge status: Home / Self Care | Payer: BLUE CROSS/BLUE SHIELD | Attending: Emergency Medicine | Admitting: Emergency Medicine

## 2015-08-17 DIAGNOSIS — Z880 Allergy status to penicillin: Secondary | ICD-10-CM | POA: Insufficient documentation

## 2015-08-17 DIAGNOSIS — G43009 Migraine without aura, not intractable, without status migrainosus: Secondary | ICD-10-CM | POA: Diagnosis not present

## 2015-08-17 DIAGNOSIS — F17210 Nicotine dependence, cigarettes, uncomplicated: Secondary | ICD-10-CM | POA: Insufficient documentation

## 2015-08-17 DIAGNOSIS — K920 Hematemesis: Secondary | ICD-10-CM | POA: Diagnosis present

## 2015-08-17 DIAGNOSIS — R1031 Right lower quadrant pain: Secondary | ICD-10-CM | POA: Diagnosis not present

## 2015-08-17 DIAGNOSIS — Z87448 Personal history of other diseases of urinary system: Secondary | ICD-10-CM | POA: Insufficient documentation

## 2015-08-17 DIAGNOSIS — R197 Diarrhea, unspecified: Secondary | ICD-10-CM | POA: Diagnosis not present

## 2015-08-17 DIAGNOSIS — Z3202 Encounter for pregnancy test, result negative: Secondary | ICD-10-CM | POA: Insufficient documentation

## 2015-08-17 DIAGNOSIS — R1032 Left lower quadrant pain: Secondary | ICD-10-CM | POA: Insufficient documentation

## 2015-08-17 DIAGNOSIS — Z79899 Other long term (current) drug therapy: Secondary | ICD-10-CM | POA: Diagnosis not present

## 2015-08-17 DIAGNOSIS — Z8742 Personal history of other diseases of the female genital tract: Secondary | ICD-10-CM | POA: Insufficient documentation

## 2015-08-17 DIAGNOSIS — M545 Low back pain: Secondary | ICD-10-CM | POA: Diagnosis not present

## 2015-08-17 LAB — COMPREHENSIVE METABOLIC PANEL
ALT: 21 U/L (ref 14–54)
AST: 24 U/L (ref 15–41)
Albumin: 4 g/dL (ref 3.5–5.0)
Alkaline Phosphatase: 53 U/L (ref 38–126)
Anion gap: 9 (ref 5–15)
BUN: 13 mg/dL (ref 6–20)
CHLORIDE: 108 mmol/L (ref 101–111)
CO2: 24 mmol/L (ref 22–32)
Calcium: 9 mg/dL (ref 8.9–10.3)
Creatinine, Ser: 1.05 mg/dL — ABNORMAL HIGH (ref 0.44–1.00)
GFR calc non Af Amer: >60 mL/min (ref >60)
Glucose, Bld: 116 mg/dL — ABNORMAL HIGH (ref 65–99)
POTASSIUM: 4 mmol/L (ref 3.5–5.1)
Sodium: 141 mmol/L (ref 135–145)
Total Bilirubin: <0.1 mg/dL — ABNORMAL LOW (ref 0.3–1.2)
Total Protein: 7.9 g/dL (ref 6.5–8.1)

## 2015-08-17 LAB — URINALYSIS, ROUTINE W REFLEX MICROSCOPIC
Bilirubin Urine: NEGATIVE
GLUCOSE, UA: NEGATIVE mg/dL
HGB URINE DIPSTICK: NEGATIVE
Ketones, ur: NEGATIVE mg/dL
Nitrite: NEGATIVE
PH: 5 (ref 5.0–8.0)
Protein, ur: NEGATIVE mg/dL
SPECIFIC GRAVITY, URINE: 1.027 (ref 1.005–1.030)

## 2015-08-17 LAB — CBC WITH DIFFERENTIAL/PLATELET
BASOS ABS: 0.1 10*3/uL (ref 0.0–0.1)
Basophils Relative: 1 %
EOS PCT: 4 %
Eosinophils Absolute: 0.2 10*3/uL (ref 0.0–0.7)
HEMATOCRIT: 40.2 % (ref 36.0–46.0)
HEMOGLOBIN: 12.4 g/dL (ref 12.0–15.0)
LYMPHS ABS: 2.8 10*3/uL (ref 0.7–4.0)
LYMPHS PCT: 50 %
MCH: 25.2 pg — AB (ref 26.0–34.0)
MCHC: 30.8 g/dL (ref 30.0–36.0)
MCV: 81.5 fL (ref 78.0–100.0)
Monocytes Absolute: 0.3 10*3/uL (ref 0.1–1.0)
Monocytes Relative: 5 %
NEUTROS ABS: 2.2 10*3/uL (ref 1.7–7.7)
Neutrophils Relative %: 40 %
Platelets: 447 10*3/uL — ABNORMAL HIGH (ref 150–400)
RBC: 4.93 MIL/uL (ref 3.87–5.11)
RDW: 14.7 % (ref 11.5–15.5)
WBC: 5.7 10*3/uL (ref 4.0–10.5)

## 2015-08-17 LAB — URINE MICROSCOPIC-ADD ON: RBC / HPF: NONE SEEN RBC/hpf (ref 0–5)

## 2015-08-17 LAB — LIPASE, BLOOD: Lipase: 136 U/L — ABNORMAL HIGH (ref 11–51)

## 2015-08-17 LAB — PREGNANCY, URINE: Preg Test, Ur: NEGATIVE

## 2015-08-17 MED ORDER — OMEPRAZOLE 20 MG PO CPDR: 20.0000 mg | DELAYED_RELEASE_CAPSULE | Freq: Two times a day (BID) | ORAL | Status: DC

## 2015-08-17 MED ORDER — HYDROCODONE-ACETAMINOPHEN 5-325 MG PO TABS: 1.0000 | ORAL_TABLET | Freq: Four times a day (QID) | ORAL | Status: DC | PRN

## 2015-08-17 NOTE — ED Provider Notes (Signed)
CSN: [id number]     Arrival date & time 08/17/15  0230 History  By signing my name below, I, Soijett Blue, attest that this documentation has been prepared under the direction and in the presence of Veryl Speak, MD. Electronically Signed: Soijett Blue, ED Scribe. 08/17/2015. 2:53 AM.  Chief Complaint  Patient presents with  . GI Bleeding      The history is provided by the patient. No language interpreter was used.    [name] is a 30 y.o. female with a medical hx of IBS, GERD, kidney disease, ovarian cyst, who presents to the Emergency Department complaining of moderate lower abdominal pain onset 2 weeks. She notes that she was at work when she began to feel nauseous and that is when she vomited twice. She reports that her vomit ranged from orange in color to dark red blood the second time. She notes that she has a new sexual partner of her boyfriend.   Pt is having associated symptoms of nausea, vomiting x 2 episodes, intermittent diarrhea, and low back pain.  She notes that she has not tried any medications for the relief of her symptoms. She denies fever, constipation, black tarry stools, vaginal bleeding/discharge, and any other symptoms. She denies any abdominal surgeries. Patient's last menstrual period was 07/26/2015.   Past Medical History  Diagnosis Date  . Ovarian cyst   . IBS (irritable bowel syndrome)   . GERD (gastroesophageal reflux disease)   . Migraine   . Kidney disease   . Common migraine 04/14/2015  . Morbidly obese [facility])    Past Surgical History  Procedure Laterality Date  . Hand surgery Right     ORIF   Family History  Problem Relation Age of Onset  . Diabetes Other   . Migraines Mother   . Diabetes Mother   . Diabetes Father   . Migraines Maternal Aunt   . Migraines Maternal Grandmother   . Diabetes Maternal Grandmother    Social History  Substance Use Topics  . Smoking status: Current Some Day Smoker    Types: Cigarettes  . Smokeless  tobacco: Never Used  . Alcohol Use: Yes     Comment: occasionally   OB History    No data available     Review of Systems  A complete 10 system review of systems was obtained and all systems are negative except as noted in the HPI and PMH.   Allergies  Penicillins  Home Medications   Prior to Admission medications   Medication Sig Start Date End Date Taking? Authorizing Provider  BIOTIN PO Take 1 tablet by mouth daily.    Historical Provider, MD  furosemide (LASIX) 20 MG tablet Take 20 mg by mouth as needed for fluid.  02/11/15   Historical Provider, MD  HYDROcodone-acetaminophen (NORCO/VICODIN) 5-325 MG per tablet Take 1 tablet by mouth every 6 (six) hours as needed. for pain 12/08/14   Historical Provider, MD  losartan (COZAAR) 25 MG tablet Take 25 mg by mouth daily. 02/11/15   Historical Provider, MD  phenazopyridine (PYRIDIUM) 200 MG tablet Take 1 tablet (200 mg total) by mouth 3 (three) times daily. 03/03/14   Tiffany Carlota Raspberry, PA-C  predniSONE (DELTASONE) 5 MG tablet Begin taking 6 tablets daily, taper by one tablet daily until off the medication. 04/14/15   Kathrynn Ducking, MD  topiramate (TOPAMAX) 25 MG tablet Take one tablet at night for one week, then take 2 tablets at night for one week, then take 3 tablets  at night. 04/14/15   Kathrynn Ducking, MD   BP 123/67 mmHg  Pulse 70  Temp(Src) 97.7 F (36.5 C) (Oral)  Resp 18  Ht 5\' 7"  (1.702 m)  Wt 292 lb (132.45 kg)  BMI 45.72 kg/m2  SpO2 97%  LMP 07/26/2015 Physical Exam  Constitutional: She is oriented to person, place, and time. She appears well-developed and well-nourished. No distress.  HENT:  Head: Normocephalic and atraumatic.  Right Ear: Hearing normal.  Left Ear: Hearing normal.  Nose: Nose normal.  Mouth/Throat: Oropharynx is clear and moist and mucous membranes are normal.  Eyes: Conjunctivae and EOM are normal. Pupils are equal, round, and reactive to light.  Neck: Normal range of motion. Neck supple.   Cardiovascular: Normal rate, regular rhythm, S1 normal, S2 normal and normal heart sounds.  Exam reveals no gallop and no friction rub.   No murmur heard. Pulmonary/Chest: Effort normal and breath sounds normal. No respiratory distress. She exhibits no tenderness.  Abdominal: Soft. Normal appearance and bowel sounds are normal. There is no hepatosplenomegaly. There is no tenderness. There is no rebound, no guarding, no tenderness at McBurney's point and negative Murphy's sign. No hernia.  Tender across the lower abdominal, RLQ, LLQ, and suprapubic region.   Musculoskeletal: Normal range of motion.  Neurological: She is alert and oriented to person, place, and time. She has normal strength. No cranial nerve deficit or sensory deficit. Coordination normal. GCS eye subscore is 4. GCS verbal subscore is 5. GCS motor subscore is 6.  Skin: Skin is warm, dry and intact. No rash noted. No cyanosis.  Psychiatric: She has a normal mood and affect. Her speech is normal and behavior is normal. Thought content normal.  Nursing note and vitals reviewed.   ED Course  Procedures (including critical care time) DIAGNOSTIC STUDIES: Oxygen Saturation is 97% on RA, nl by my interpretation.    COORDINATION OF CARE: 2:53 AM Discussed treatment plan with pt at bedside which includes UA, labs, and pt agreed to plan.    Labs Review Labs Reviewed  COMPREHENSIVE METABOLIC PANEL - Abnormal; Notable for the following:    Glucose, Bld 116 (*)    Creatinine, Ser 1.05 (*)    Total Bilirubin <0.1 (*)    All other components within normal limits  LIPASE, BLOOD - Abnormal; Notable for the following:    Lipase 136 (*)    All other components within normal limits  CBC WITH DIFFERENTIAL/PLATELET - Abnormal; Notable for the following:    MCH 25.2 (*)    Platelets 447 (*)    All other components within normal limits  URINALYSIS, ROUTINE W REFLEX MICROSCOPIC (NOT AT [facility]) - Abnormal; Notable for the following:     Leukocytes, UA MODERATE (*)    All other components within normal limits  URINE MICROSCOPIC-ADD ON - Abnormal; Notable for the following:    Squamous Epithelial / LPF 0-5 (*)    Bacteria, UA FEW (*)    All other components within normal limits  PREGNANCY, URINE    Imaging Review No results found. I have personally reviewed and evaluated these images and lab results as part of my medical decision-making.    MDM   Final diagnoses:  None    Patient presents with complaints of abdominal discomfort and vomiting. She has been having lower abdominal discomfort for the past week in the absence of any vaginal bleeding, vaginal discharge, diarrhea, constipation. She vomited this evening and it was orange in color. She vomited a second  time and it was primarily blood. Her abdomen is benign and from the sounds of her history, I suspect she may have experienced a Mallory-Weiss tear. Her blood counts are normal and she is hemodynamically stable. Her white count is 5.7. Laboratory studies do show a lipase of 136, the significance of this I am uncertain. She has no epigastric tenderness and no upper abdominal tenderness. She does not drink alcohol and her history is not consistent with a gallstone.  I discussed the possibility of further testing, however the patient is declining this at this time. We have discussed the risks and benefits of a CAT scan. She tells me that the abdominal discomfort is not was concerning her, rather the blood that she vomited. Again I highly suspect this is a Mallory-Weiss tear as her initial emesis was nonbloody and the second emesis was. She will be discharged with Prilosec, pain medication, and when necessary return in the next 24-48 hours if she is not improving or if she worsens. She has had no further vomiting while in the emergency department and she remains hemodynamically stable and her abdomen remains benign.  I personally performed the services described in this  documentation, which was scribed in my presence. The recorded information has been reviewed and is accurate.       Veryl Speak, MD 08/17/15 [phone]

## 2015-08-17 NOTE — ED Notes (Signed)
Pt states that she has been having nausea and abd pain over the last week or so; pt states that she vomited tonight x 2; pt states that the first time she vomited it was orangish in color and then the 2nd time pt states "It was just straight blood" pt states that it was dark red bloody emesis; pt continues to c/o abd pain; pt has had intermittent diarrhea

## 2015-08-17 NOTE — Discharge Instructions (Signed)
Prilosec as prescribed.  Hydrocodone as prescribed as needed for pain.  Return to the emergency department if you develop worsening abdominal pain, bleeding, high fever, or other new and concerning symptoms.   Hematemesis Hematemesis is when you vomit blood. It is a sign of bleeding in the upper part of your digestive tract. This is also called your gastrointestinal (GI) tract. Your upper GI tract includes your mouth, throat, esophagus, stomach, and the first part of your small intestine (duodenum).  Hematemesis is usually caused by bleeding from your esophagus or stomach. You may suddenly vomit bright red blood. You might also vomit old blood. It may look like coffee grounds. You may also have other symptoms, such as:  Stomach pain.  Heartburn.  Black and tarry stool.  HOME CARE INSTRUCTIONS  Watch your hematemesis for any changes. The following actions may help to lessen any discomfort you are feeling:  Take medicines only as directed by your health care provider. Do not take aspirin, ibuprofen, or any other anti-inflammatory medicine without approval from your health care provider.  Rest as needed.  Drink small sips of clear liquids often, as long as you can keep them down. Try to drink enough fluids to keep your urine clear or pale yellow.  Do not drink alcohol.  Do not use any tobacco products, including cigarettes, chewing tobacco, or electronic cigarettes. If you need help quitting, ask your health care provider.  Keep all follow-up visits as directed by your health care provider. This is important. SEEK MEDICAL CARE IF:   The vomiting of blood worsens, or begins again after it has stopped.  You have persistent stomach pain.  You have nausea, indigestion, or heartburn.  You feel weak or dizzy. SEEK IMMEDIATE MEDICAL CARE IF:   You faint or feel extremely weak.  You have a rapid heartbeat.  You are urinating less than normal or not at all.  You have persistent  vomiting.  You vomit large amounts of bloody or dark material.  You vomit bright red blood.  You pass large, dark, or bloody stools.  You have chest pain or trouble breathing.   This information is not intended to replace advice given to you by your health care provider. Make sure you discuss any questions you have with your health care provider.   Document Released: 08/16/2004 Document Revised: 07/30/2014 Document Reviewed: 03/03/2014 Elsevier Interactive Patient Education [employer].

## 2015-08-19 ENCOUNTER — Emergency Department (HOSPITAL_COMMUNITY)
Admission: EM | Admit: 2015-08-19 | Discharge: 2015-08-19 | Disposition: A | Discharge status: Home / Self Care | Payer: BLUE CROSS/BLUE SHIELD | Attending: Emergency Medicine | Admitting: Emergency Medicine

## 2015-08-19 ENCOUNTER — Encounter (HOSPITAL_COMMUNITY): Payer: Self-pay

## 2015-08-19 ENCOUNTER — Emergency Department (HOSPITAL_COMMUNITY): Payer: BLUE CROSS/BLUE SHIELD

## 2015-08-19 DIAGNOSIS — R05 Cough: Secondary | ICD-10-CM

## 2015-08-19 DIAGNOSIS — J069 Acute upper respiratory infection, unspecified: Secondary | ICD-10-CM | POA: Insufficient documentation

## 2015-08-19 DIAGNOSIS — R059 Cough, unspecified: Secondary | ICD-10-CM

## 2015-08-19 DIAGNOSIS — Z880 Allergy status to penicillin: Secondary | ICD-10-CM | POA: Insufficient documentation

## 2015-08-19 DIAGNOSIS — K219 Gastro-esophageal reflux disease without esophagitis: Secondary | ICD-10-CM | POA: Insufficient documentation

## 2015-08-19 DIAGNOSIS — Z79899 Other long term (current) drug therapy: Secondary | ICD-10-CM | POA: Insufficient documentation

## 2015-08-19 DIAGNOSIS — J680 Bronchitis and pneumonitis due to chemicals, gases, fumes and vapors: Secondary | ICD-10-CM | POA: Insufficient documentation

## 2015-08-19 DIAGNOSIS — Z87448 Personal history of other diseases of urinary system: Secondary | ICD-10-CM | POA: Insufficient documentation

## 2015-08-19 DIAGNOSIS — Z8679 Personal history of other diseases of the circulatory system: Secondary | ICD-10-CM | POA: Insufficient documentation

## 2015-08-19 DIAGNOSIS — F17210 Nicotine dependence, cigarettes, uncomplicated: Secondary | ICD-10-CM | POA: Insufficient documentation

## 2015-08-19 DIAGNOSIS — Z8742 Personal history of other diseases of the female genital tract: Secondary | ICD-10-CM | POA: Insufficient documentation

## 2015-08-19 DIAGNOSIS — T59891A Toxic effect of other specified gases, fumes and vapors, accidental (unintentional), initial encounter: Secondary | ICD-10-CM | POA: Insufficient documentation

## 2015-08-19 MED ORDER — AEROCHAMBER PLUS FLO-VU MEDIUM MISC
1.0000 | Freq: Once | Status: DC
  Filled 2015-08-19: qty 1

## 2015-08-19 MED ORDER — PREDNISONE 20 MG PO TABS
60.0000 mg | ORAL_TABLET | Freq: Once | ORAL | Status: AC
  Administered 2015-08-19: 60 mg via ORAL
  Filled 2015-08-19: qty 3

## 2015-08-19 MED ORDER — ALBUTEROL SULFATE HFA 108 (90 BASE) MCG/ACT IN AERS
2.0000 | INHALATION_SPRAY | Freq: Four times a day (QID) | RESPIRATORY_TRACT | Status: DC
  Administered 2015-08-19: 2 via RESPIRATORY_TRACT
  Filled 2015-08-19: qty 6.7

## 2015-08-19 MED ORDER — GUAIFENESIN ER 1200 MG PO TB12: 1.0000 | ORAL_TABLET | Freq: Two times a day (BID) | ORAL | Status: DC

## 2015-08-19 MED ORDER — IPRATROPIUM-ALBUTEROL 0.5-2.5 (3) MG/3ML IN SOLN
3.0000 mL | Freq: Once | RESPIRATORY_TRACT | Status: AC
  Administered 2015-08-19: 3 mL via RESPIRATORY_TRACT
  Filled 2015-08-19: qty 3

## 2015-08-19 MED ORDER — PREDNISONE 50 MG PO TABS: 50.0000 mg | ORAL_TABLET | Freq: Every day | ORAL | Status: DC

## 2015-08-19 MED ORDER — ACETAMINOPHEN-CODEINE 120-12 MG/5ML PO SOLN: 10.0000 mL | ORAL | Status: DC | PRN

## 2015-08-19 NOTE — ED Notes (Signed)
Per pt, Seen here on Wednesday for vomiting blood.  Cleared to go home. Went home and cleaned with chemicals.  Cough started that night.  Coughing since then with no fever.

## 2015-08-19 NOTE — Discharge Instructions (Signed)
Return here as needed.  Follow-up with your primary care doctor.  Your chest x-ray did not show any significant abnormality

## 2015-08-19 NOTE — ED Provider Notes (Signed)
CSN: [id number]     Arrival date & time 08/19/15  1000 History   First MD Initiated Contact with Patient 08/19/15 1015     Chief Complaint  Patient presents with  . Nasal Congestion  . Cough     (Consider location/radiation/quality/duration/timing/severity/associated sxs/prior Treatment) HPI Patient presents to the emergency department with nasal congestion and cough over the last 2 days.  Patient states that she was at home cleaning with pneumonia and started feeling a sudden irritation of her nasal passages and started coughing.  The patient states she did not mix ammonia with any other chemicals.  Patient states she did dilute the ammonia with water.  The patient states she does not have any fever, nausea, vomiting, weakness, dizziness, headache, blurred vision, back pain, neck pain area, incontinence, chest pain, shortness of breath, back pain, rash, near syncope, lethargy, edema, or syncope.  The patient states that nothing seems to make her condition better or worse.  The patient did not take any medications prior to arrival Past Medical History  Diagnosis Date  . Ovarian cyst   . IBS (irritable bowel syndrome)   . GERD (gastroesophageal reflux disease)   . Migraine   . Kidney disease   . Common migraine 04/14/2015  . Morbidly obese [facility])    Past Surgical History  Procedure Laterality Date  . Hand surgery Right     ORIF   Family History  Problem Relation Age of Onset  . Diabetes Other   . Migraines Mother   . Diabetes Mother   . Diabetes Father   . Migraines Maternal Aunt   . Migraines Maternal Grandmother   . Diabetes Maternal Grandmother    Social History  Substance Use Topics  . Smoking status: Current Some Day Smoker    Types: Cigarettes  . Smokeless tobacco: Never Used  . Alcohol Use: Yes     Comment: occasionally   OB History    No data available     Review of Systems  All other systems negative except as documented in the HPI. All pertinent positives  and negatives as reviewed in the HPI.  Allergies  Penicillins  Home Medications   Prior to Admission medications   Medication Sig Start Date End Date Taking? Authorizing Provider  ibuprofen (ADVIL,MOTRIN) 200 MG tablet Take 400 mg by mouth every 6 (six) hours as needed for headache or moderate pain.   Yes Historical Provider, MD  HYDROcodone-acetaminophen (NORCO) 5-325 MG tablet Take 1 tablet by mouth every 6 (six) hours as needed. 08/17/15   Veryl Speak, MD  omeprazole (PRILOSEC) 20 MG capsule Take 1 capsule (20 mg total) by mouth 2 (two) times daily before a meal. 08/17/15   Veryl Speak, MD   BP 132/89 mmHg  Pulse 87  Temp(Src) 98.1 F (36.7 C) (Oral)  Resp 18  SpO2 100%  LMP 07/26/2015 Physical Exam  Constitutional: She is oriented to person, place, and time. She appears well-developed and well-nourished. No distress.  HENT:  Head: Normocephalic and atraumatic.  Mouth/Throat: Oropharynx is clear and moist.  Eyes: Pupils are equal, round, and reactive to light.  Neck: Normal range of motion. Neck supple.  Cardiovascular: Normal rate, regular rhythm and normal heart sounds.  Exam reveals no gallop and no friction rub.   No murmur heard. Pulmonary/Chest: Effort normal and breath sounds normal. No respiratory distress. She has no wheezes.  Abdominal: Soft. Bowel sounds are normal. She exhibits no distension. There is no tenderness.  Neurological: She is alert and  oriented to person, place, and time. She exhibits normal muscle tone. Coordination normal.  Skin: Skin is warm and dry. No rash noted. No erythema.  Psychiatric: She has a normal mood and affect. Her behavior is normal.  Nursing note and vitals reviewed.   ED Course  Procedures (including critical care time) Labs Review Labs Reviewed - No data to display  Imaging Review Dg Chest 2 View  08/19/2015  CLINICAL DATA:  Worsening cough and congestion. Shortness of breath. EXAM: CHEST  2 VIEW COMPARISON:  08/27/2005  FINDINGS: The heart size and mediastinal contours are within normal limits. Both lungs are clear. The visualized skeletal structures are unremarkable. IMPRESSION: No active cardiopulmonary disease. Electronically Signed   By: Fidela Salisbury M.D.   On: 08/19/2015 10:42   I have personally reviewed and evaluated these images and lab results as part of my medical decision-making.  Patient will be treated for URI versus chemical irritation of the airways, but seems more like viral symptoms.  Told to return here as needed.  Patient is advised to increase her fluid intake and rest as much as possible.  I reviewed the patient's x-rays.  No significant abnormalities noted   Dalia Heading, PA-C 08/19/15 1224  Julianne Rice, MD 08/19/15 1537

## 2015-09-28 ENCOUNTER — Encounter (HOSPITAL_COMMUNITY): Payer: Self-pay | Admitting: Emergency Medicine

## 2015-09-28 ENCOUNTER — Emergency Department (HOSPITAL_COMMUNITY)
Admission: EM | Admit: 2015-09-28 | Discharge: 2015-09-28 | Disposition: A | Discharge status: Home / Self Care | Payer: Self-pay | Attending: Emergency Medicine | Admitting: Emergency Medicine

## 2015-09-28 ENCOUNTER — Emergency Department (HOSPITAL_COMMUNITY): Payer: No Typology Code available for payment source

## 2015-09-28 DIAGNOSIS — Y92410 Unspecified street and highway as the place of occurrence of the external cause: Secondary | ICD-10-CM | POA: Insufficient documentation

## 2015-09-28 DIAGNOSIS — Y9389 Activity, other specified: Secondary | ICD-10-CM | POA: Insufficient documentation

## 2015-09-28 DIAGNOSIS — K219 Gastro-esophageal reflux disease without esophagitis: Secondary | ICD-10-CM | POA: Insufficient documentation

## 2015-09-28 DIAGNOSIS — Z23 Encounter for immunization: Secondary | ICD-10-CM | POA: Insufficient documentation

## 2015-09-28 DIAGNOSIS — Z8679 Personal history of other diseases of the circulatory system: Secondary | ICD-10-CM | POA: Insufficient documentation

## 2015-09-28 DIAGNOSIS — Z3202 Encounter for pregnancy test, result negative: Secondary | ICD-10-CM | POA: Insufficient documentation

## 2015-09-28 DIAGNOSIS — Z7952 Long term (current) use of systemic steroids: Secondary | ICD-10-CM | POA: Insufficient documentation

## 2015-09-28 DIAGNOSIS — F17210 Nicotine dependence, cigarettes, uncomplicated: Secondary | ICD-10-CM | POA: Insufficient documentation

## 2015-09-28 DIAGNOSIS — S0990XA Unspecified injury of head, initial encounter: Secondary | ICD-10-CM | POA: Insufficient documentation

## 2015-09-28 DIAGNOSIS — Z8742 Personal history of other diseases of the female genital tract: Secondary | ICD-10-CM | POA: Insufficient documentation

## 2015-09-28 DIAGNOSIS — S161XXA Strain of muscle, fascia and tendon at neck level, initial encounter: Secondary | ICD-10-CM | POA: Insufficient documentation

## 2015-09-28 DIAGNOSIS — Z87448 Personal history of other diseases of urinary system: Secondary | ICD-10-CM | POA: Insufficient documentation

## 2015-09-28 DIAGNOSIS — Z79899 Other long term (current) drug therapy: Secondary | ICD-10-CM | POA: Insufficient documentation

## 2015-09-28 DIAGNOSIS — Z880 Allergy status to penicillin: Secondary | ICD-10-CM | POA: Insufficient documentation

## 2015-09-28 DIAGNOSIS — Y998 Other external cause status: Secondary | ICD-10-CM | POA: Insufficient documentation

## 2015-09-28 LAB — I-STAT CHEM 8, ED
BUN: 7 mg/dL (ref 6–20)
Calcium, Ion: 1.17 mmol/L (ref 1.12–1.23)
Chloride: 106 mmol/L (ref 101–111)
Creatinine, Ser: 1 mg/dL (ref 0.44–1.00)
GLUCOSE: 115 mg/dL — AB (ref 65–99)
HCT: 43 % (ref 36.0–46.0)
HEMOGLOBIN: 14.6 g/dL (ref 12.0–15.0)
POTASSIUM: 4 mmol/L (ref 3.5–5.1)
SODIUM: 143 mmol/L (ref 135–145)
TCO2: 24 mmol/L (ref 0–100)

## 2015-09-28 LAB — CBC WITH DIFFERENTIAL/PLATELET
BASOS PCT: 1 %
Basophils Absolute: 0.1 10*3/uL (ref 0.0–0.1)
EOS ABS: 0.1 10*3/uL (ref 0.0–0.7)
Eosinophils Relative: 1 %
HCT: 39.6 % (ref 36.0–46.0)
HEMOGLOBIN: 12.8 g/dL (ref 12.0–15.0)
Lymphocytes Relative: 38 %
Lymphs Abs: 3.1 10*3/uL (ref 0.7–4.0)
MCH: 25.2 pg — ABNORMAL LOW (ref 26.0–34.0)
MCHC: 32.3 g/dL (ref 30.0–36.0)
MCV: 78.1 fL (ref 78.0–100.0)
MONO ABS: 0.4 10*3/uL (ref 0.1–1.0)
MONOS PCT: 5 %
NEUTROS PCT: 55 %
Neutro Abs: 4.6 10*3/uL (ref 1.7–7.7)
Platelets: 430 10*3/uL — ABNORMAL HIGH (ref 150–400)
RBC: 5.07 MIL/uL (ref 3.87–5.11)
RDW: 14.9 % (ref 11.5–15.5)
WBC: 8.2 10*3/uL (ref 4.0–10.5)

## 2015-09-28 LAB — I-STAT BETA HCG BLOOD, ED (MC, WL, AP ONLY)

## 2015-09-28 MED ORDER — DIPHENHYDRAMINE HCL 50 MG/ML IJ SOLN
25.0000 mg | Freq: Once | INTRAMUSCULAR | Status: AC
  Administered 2015-09-28: 25 mg via INTRAVENOUS
  Filled 2015-09-28: qty 1

## 2015-09-28 MED ORDER — NAPROXEN 500 MG PO TABS: 500.0000 mg | ORAL_TABLET | Freq: Two times a day (BID) | ORAL | Status: DC

## 2015-09-28 MED ORDER — TETANUS-DIPHTH-ACELL PERTUSSIS 5-2.5-18.5 LF-MCG/0.5 IM SUSP
0.5000 mL | Freq: Once | INTRAMUSCULAR | Status: AC
  Administered 2015-09-28: 0.5 mL via INTRAMUSCULAR
  Filled 2015-09-28: qty 0.5

## 2015-09-28 MED ORDER — METOCLOPRAMIDE HCL 5 MG/ML IJ SOLN
10.0000 mg | Freq: Once | INTRAMUSCULAR | Status: AC
  Administered 2015-09-28: 10 mg via INTRAVENOUS
  Filled 2015-09-28: qty 2

## 2015-09-28 MED ORDER — IOHEXOL 350 MG/ML SOLN
80.0000 mL | Freq: Once | INTRAVENOUS | Status: AC | PRN
  Administered 2015-09-28: 80 mL via INTRAVENOUS

## 2015-09-28 MED ORDER — SODIUM CHLORIDE 0.9 % IV BOLUS (SEPSIS)
1000.0000 mL | Freq: Once | INTRAVENOUS | Status: AC
  Administered 2015-09-28: 1000 mL via INTRAVENOUS

## 2015-09-28 MED ORDER — ACETAMINOPHEN 500 MG PO TABS
1000.0000 mg | ORAL_TABLET | Freq: Once | ORAL | Status: AC
  Administered 2015-09-28: 1000 mg via ORAL
  Filled 2015-09-28: qty 2
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
## 2015-09-28 NOTE — Discharge Instructions (Signed)

## 2015-09-28 NOTE — ED Notes (Signed)
MD at bedside. 

## 2015-09-28 NOTE — ED Notes (Signed)
Patient transported to CT 
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
## 2016-11-22 ENCOUNTER — Emergency Department (HOSPITAL_COMMUNITY)
Admission: EM | Admit: 2016-11-22 | Discharge: 2016-11-22 | Disposition: A | Discharge status: Home / Self Care | Payer: Self-pay | Attending: Emergency Medicine | Admitting: Emergency Medicine

## 2016-11-22 ENCOUNTER — Encounter (HOSPITAL_COMMUNITY): Payer: Self-pay | Admitting: Emergency Medicine

## 2016-11-22 DIAGNOSIS — M545 Low back pain, unspecified: Secondary | ICD-10-CM

## 2016-11-22 DIAGNOSIS — M62830 Muscle spasm of back: Secondary | ICD-10-CM | POA: Insufficient documentation

## 2016-11-22 DIAGNOSIS — Z87891 Personal history of nicotine dependence: Secondary | ICD-10-CM | POA: Insufficient documentation

## 2016-11-22 DIAGNOSIS — Z79899 Other long term (current) drug therapy: Secondary | ICD-10-CM | POA: Insufficient documentation

## 2016-11-22 MED ORDER — METHOCARBAMOL 500 MG PO TABS: 500.0000 mg | ORAL_TABLET | Freq: Two times a day (BID) | ORAL | 0 refills | Status: DC

## 2016-11-22 MED ORDER — HYDROCODONE-ACETAMINOPHEN 5-325 MG PO TABS
2.0000 | ORAL_TABLET | Freq: Once | ORAL | Status: AC
  Administered 2016-11-22: 2 via ORAL
  Filled 2016-11-22: qty 2

## 2016-11-22 MED ORDER — KETOROLAC TROMETHAMINE 60 MG/2ML IM SOLN
60.0000 mg | Freq: Once | INTRAMUSCULAR | Status: AC
Start: 2016-11-22 — End: 2016-11-22
  Administered 2016-11-22: 60 mg via INTRAMUSCULAR
  Filled 2016-11-22: qty 2

## 2016-11-22 MED ORDER — METHOCARBAMOL 500 MG PO TABS
1000.0000 mg | ORAL_TABLET | Freq: Once | ORAL | Status: AC
  Administered 2016-11-22: 1000 mg via ORAL
  Filled 2016-11-22: qty 2

## 2016-11-22 NOTE — ED Notes (Signed)
Bed: GN56 Expected date:  Expected time:  Means of arrival:  Comments: Back spasms

## 2016-11-22 NOTE — ED Triage Notes (Signed)
Pt brought in via [facility] from work c/o lower back pain/spasms. Pt reports she injured her back approx 2 weeks ago was seen and discharged w/ muscle relaxer and pain medication. Today was pt's first day back to work and pt reports back pain and spasms that began approx 45 minutes ago. Pt AOx4. Ambulatory.

## 2016-11-22 NOTE — ED Provider Notes (Signed)
[facility] DEPT Provider Note   CSN: [id number] Arrival date & time: 11/22/16  0243     History   Chief Complaint Chief Complaint  Patient presents with  . Back Pain    HPI [name] is a 31 y.o. female with a hx of migraine, GERD presents to the Emergency Department complaining of gradual, persistent, progressively worsening low back pain onset around 12:30am. Pt reports 2 weeks ago she was lifting something heavy at work when she heard a "pop." She had low back pain at that time and was evaluated at [facility] with normal plain films of the l-spine.  Pt reports intermittent pain since then, but pain returned tonight when she was standing at work.  Pt reports associated muscle spasms in the low back and thoracic region.  Pt denies numbness, weakness, gait disturbance, IV drug use, fevers, chills, loss of bowel or bladder control, saddle anesthesia.  Pt took etodolac before coming to the ED without relief.  Pt denies a "Pop" tonight.    The history is provided by the patient and medical records. No language interpreter was used.    Past Medical History:  Diagnosis Date  . Common migraine 04/14/2015  . GERD (gastroesophageal reflux disease)   . IBS (irritable bowel syndrome)   . Kidney disease   . Migraine   . Morbidly obese ([place])   . Ovarian cyst     Patient Active Problem List   Diagnosis Date Noted  . Common migraine 04/14/2015  . Plantar fasciitis 02/28/2015  . Hammertoe 02/28/2015    Past Surgical History:  Procedure Laterality Date  . HAND SURGERY Right    ORIF    OB History    No data available       Home Medications    Prior to Admission medications   Medication Sig Start Date End Date Taking? Authorizing Provider  ibuprofen (ADVIL,MOTRIN) 200 MG tablet Take 400 mg by mouth every 6 (six) hours as needed for headache or moderate pain.    Historical Provider, MD  methocarbamol (ROBAXIN) 500 MG tablet Take 1 tablet (500 mg total) by mouth 2 (two) times  daily. 11/22/16   Alyssabeth Bruster, PA-C  naproxen (NAPROSYN) 500 MG tablet Take 1 tablet (500 mg total) by mouth 2 (two) times daily. 09/28/15   Fredia Sorrow, MD    Family History Family History  Problem Relation Age of Onset  . Migraines Mother   . Diabetes Mother   . Diabetes Father   . Diabetes Other   . Migraines Maternal Aunt   . Migraines Maternal Grandmother   . Diabetes Maternal Grandmother     Social History Social History  Substance Use Topics  . Smoking status: Former Smoker    Types: Cigarettes  . Smokeless tobacco: Never Used  . Alcohol use Yes     Comment: occasionally     Allergies   Penicillins   Review of Systems Review of Systems  Constitutional: Negative for fatigue and fever.  Respiratory: Negative for chest tightness and shortness of breath.   Cardiovascular: Negative for chest pain.  Gastrointestinal: Negative for abdominal pain, diarrhea, nausea and vomiting.  Genitourinary: Negative for dysuria, frequency, hematuria and urgency.  Musculoskeletal: Positive for back pain. Negative for joint swelling, neck pain and neck stiffness.  Skin: Negative for rash.  Neurological: Negative for weakness, light-headedness, numbness and headaches.  All other systems reviewed and are negative.    Physical Exam Updated Vital Signs BP 122/64 (BP Location: Left Arm)  Pulse 98   Temp 97.9 F (36.6 C) (Oral)   Resp 20   Ht 5\' 7"  (1.702 m)   Wt 115.7 kg   LMP 11/22/2016   SpO2 95%   BMI 39.94 kg/m   Physical Exam  Constitutional: She appears well-developed and well-nourished. No distress.  HENT:  Head: Normocephalic and atraumatic.  Mouth/Throat: Oropharynx is clear and moist. No oropharyngeal exudate.  Eyes: Conjunctivae are normal.  Neck: Normal range of motion. Neck supple.  Full ROM without pain  Cardiovascular: Normal rate, regular rhythm and intact distal pulses.   Pulmonary/Chest: Effort normal and breath sounds normal. No respiratory  distress. She has no wheezes.  Abdominal: Soft. She exhibits no distension. There is no tenderness.  Musculoskeletal:  Full range of motion of the T-spine and L-spine No midline tenderness to the  T-spine or L-spine Tenderness to palpation of the bilateral paraspinous muscles of the lower T spine and L-spine  Lymphadenopathy:    She has no cervical adenopathy.  Neurological: She is alert.  Speech is clear and goal oriented, follows commands Normal 5/5 strength in upper and lower extremities bilaterally including dorsiflexion and plantar flexion, strong and equal grip strength Sensation normal to light and sharp touch Moves extremities without ataxia, coordination intact Normal gait Normal balance No Clonus  Skin: Skin is warm and dry. No rash noted. She is not diaphoretic. No erythema.  Psychiatric: She has a normal mood and affect. Her behavior is normal.  Nursing note and vitals reviewed.    ED Treatments / Results   Procedures Procedures (including critical care time)  Medications Ordered in ED Medications  methocarbamol (ROBAXIN) tablet 1,000 mg (1,000 mg Oral Given 11/22/16 0402)  HYDROcodone-acetaminophen (NORCO/VICODIN) 5-325 MG per tablet 2 tablet (2 tablets Oral Given 11/22/16 0402)  ketorolac (TORADOL) injection 60 mg (60 mg Intramuscular Given 11/22/16 0403)     Initial Impression / Assessment and Plan / ED Course  I have reviewed the triage vital signs and the nursing notes.  Pertinent labs & imaging results that were available during my care of the patient were reviewed by me and considered in my medical decision making (see chart for details).     Patient with nontraumatic back pain.  No neurological deficits and normal neuro exam.  Patient can walk but states is painful.  No loss of bowel or bladder control.  No concern for cauda equina.  No fever, night sweats, weight loss, h/o cancer, IVDU.  RICE protocol and pain medicine indicated and discussed with patient.     Final Clinical Impressions(s) / ED Diagnoses   Final diagnoses:  Bilateral low back pain without sciatica, unspecified chronicity  Muscle spasm of back    New Prescriptions New Prescriptions   METHOCARBAMOL (ROBAXIN) 500 MG TABLET    Take 1 tablet (500 mg total) by mouth 2 (two) times daily.     Abigail Butts, PA-C 11/22/16 0453    April Palumbo, MD 11/22/16 [phone]

## 2016-11-22 NOTE — Discharge Instructions (Signed)
1. Medications: robaxin, usual home medications °2. Treatment: rest, drink plenty of fluids, gentle stretching as discussed, alternate ice and heat °3. Follow Up: Please followup with your primary doctor in 3 days for discussion of your diagnoses and further evaluation after today's visit; if you do not have a primary care doctor use the resource guide provided to find one;  Return to the ER for worsening back pain, difficulty walking, loss of bowel or bladder control or other concerning symptoms ° ° ° °

## 2016-11-26 ENCOUNTER — Ambulatory Visit (INDEPENDENT_AMBULATORY_CARE_PROVIDER_SITE_OTHER): Payer: BLUE CROSS/BLUE SHIELD | Admitting: Orthopaedic Surgery

## 2016-11-26 ENCOUNTER — Encounter (INDEPENDENT_AMBULATORY_CARE_PROVIDER_SITE_OTHER): Payer: Self-pay | Admitting: Orthopaedic Surgery

## 2016-11-26 DIAGNOSIS — M545 Low back pain, unspecified: Secondary | ICD-10-CM

## 2016-11-26 DIAGNOSIS — G8929 Other chronic pain: Secondary | ICD-10-CM

## 2016-11-26 MED ORDER — NAPROXEN 500 MG PO TABS: 500.0000 mg | ORAL_TABLET | Freq: Two times a day (BID) | ORAL | 0 refills | Status: DC

## 2016-11-26 MED ORDER — CYCLOBENZAPRINE HCL 5 MG PO TABS: 5.0000 mg | ORAL_TABLET | Freq: Three times a day (TID) | ORAL | 3 refills | Status: DC | PRN

## 2016-11-26 NOTE — Progress Notes (Signed)
Office Visit Note   Patient: [name]           Date of Birth: 16-Mar-1986           MRN: [id number] Visit Date: 11/26/2016              Requested by: Donald Prose, MD [address], [place] [address] PCP: Sandi Mariscal, MD   Assessment & Plan: Visit Diagnoses:  1. Chronic low back pain without sciatica, unspecified back pain laterality     Plan: Patient has continued low back pain despite conservative treatment. Flexeril and Naprosyn were prescribed. Recommend MRI to rule out structural abnormalities. She may restart physical therapy next week if she feels up to it. I'll see her back in about 4 weeks. In the meantime I will have my office contact her about the MRI results  Follow-Up Instructions: Return in about 4 weeks (around 12/24/2016).   Orders:  Orders Placed This Encounter  Procedures  . MR LUMBAR SPINE WO CONTRAST   Meds ordered this encounter  Medications  . cyclobenzaprine (FLEXERIL) 5 MG tablet    Sig: Take 1-2 tablets (5-10 mg total) by mouth 3 (three) times daily as needed for muscle spasms.    Dispense:  30 tablet    Refill:  3  . naproxen (NAPROSYN) 500 MG tablet    Sig: Take 1 tablet (500 mg total) by mouth 2 (two) times daily.    Dispense:  60 tablet    Refill:  0      Procedures: No procedures performed   Clinical Data: No additional findings.   Subjective: Chief Complaint  Patient presents with  . Middle Back - Pain, Injury  . Lower Back - Pain, Injury    Patient is a 31 year old female comes in with back spasms and pain since 11/01/2016. She lifted something heavy and felt a pop. She works in a [employer] and does prolonged standing and lifting. She has been to 2 sessions of physical therapy without much relief. She has some soreness in the left leg. Denies any signs or symptoms of cauda equina. She has a burning pain. She has pain throughout her lower back.    Review of Systems  Constitutional: Negative.   HENT:  Negative.   Eyes: Negative.   Respiratory: Negative.   Cardiovascular: Negative.   Endocrine: Negative.   Musculoskeletal: Negative.   Neurological: Negative.   Hematological: Negative.   Psychiatric/Behavioral: Negative.   All other systems reviewed and are negative.    Objective: Vital Signs: LMP 11/22/2016   Physical Exam  Constitutional: She is oriented to person, place, and time. She appears well-developed and well-nourished.  HENT:  Head: Normocephalic and atraumatic.  Eyes: EOM are normal.  Neck: Neck supple.  Pulmonary/Chest: Effort normal.  Abdominal: Soft.  Neurological: She is alert and oriented to person, place, and time.  Skin: Skin is warm. Capillary refill takes less than 2 seconds.  Psychiatric: She has a normal mood and affect. Her behavior is normal. Judgment and thought content normal.  Nursing note and vitals reviewed.   Ortho Exam Exam of lumbar spine shows generalized discomfort with palpation of the lumbar spine. She has normal and symmetric reflexes. Neurovascular intact and nonfocal exam Specialty Comments:  No specialty comments available.  Imaging: No results found.   PMFS History: Patient Active Problem List   Diagnosis Date Noted  . Common migraine 04/14/2015  . Plantar fasciitis 02/28/2015  . Hammertoe 02/28/2015   Past  Medical History:  Diagnosis Date  . Common migraine 04/14/2015  . GERD (gastroesophageal reflux disease)   . IBS (irritable bowel syndrome)   . Kidney disease   . Migraine   . Morbidly obese ([place])   . Ovarian cyst     Family History  Problem Relation Age of Onset  . Migraines Mother   . Diabetes Mother   . Diabetes Father   . Diabetes Other   . Migraines Maternal Aunt   . Migraines Maternal Grandmother   . Diabetes Maternal Grandmother     Past Surgical History:  Procedure Laterality Date  . HAND SURGERY Right    ORIF   Social History   Occupational History  . [employer]   . [employer]     Social History Main Topics  . Smoking status: Former Smoker    Types: Cigarettes  . Smokeless tobacco: Never Used  . Alcohol use Yes     Comment: occasionally  . Drug use: Yes    Types: Marijuana  . Sexual activity: Not on file

## 2016-12-05 ENCOUNTER — Telehealth (INDEPENDENT_AMBULATORY_CARE_PROVIDER_SITE_OTHER): Payer: Self-pay | Admitting: Orthopaedic Surgery

## 2016-12-05 NOTE — Telephone Encounter (Signed)
11/26/2016 OV NOTE FAXED TO [facility] [phone]

## 2016-12-08 ENCOUNTER — Inpatient Hospital Stay: Admission: RE | Admit: 2016-12-08 | Payer: Self-pay | Source: Ambulatory Visit

## 2016-12-12 ENCOUNTER — Telehealth (INDEPENDENT_AMBULATORY_CARE_PROVIDER_SITE_OTHER): Payer: Self-pay

## 2016-12-12 NOTE — Telephone Encounter (Signed)
Received vm from [facility] stating they did not auth pt to treat here  And didn't know how she got to [place]  And they would not be auth MRI or any other treatment. IC her back and left vm adv pt was seen in the ER on 11/22/16 and referred to [place] because Dr. Erlinda Hong was on call and that I need clarification if the 11/26/16 visit is going to be covered under [facility] and if so need the billing info

## 2016-12-27 ENCOUNTER — Ambulatory Visit (INDEPENDENT_AMBULATORY_CARE_PROVIDER_SITE_OTHER): Payer: BLUE CROSS/BLUE SHIELD | Admitting: Orthopaedic Surgery

## 2016-12-27 ENCOUNTER — Encounter (INDEPENDENT_AMBULATORY_CARE_PROVIDER_SITE_OTHER): Payer: Self-pay | Admitting: Orthopaedic Surgery

## 2016-12-27 DIAGNOSIS — M545 Low back pain, unspecified: Secondary | ICD-10-CM

## 2016-12-27 DIAGNOSIS — G8929 Other chronic pain: Secondary | ICD-10-CM | POA: Diagnosis not present

## 2016-12-27 MED ORDER — GABAPENTIN 100 MG PO CAPS: 100.0000 mg | ORAL_CAPSULE | Freq: Three times a day (TID) | ORAL | 3 refills | Status: DC

## 2016-12-27 NOTE — Progress Notes (Signed)
Office Visit Note   Patient: [name]           Date of Birth: 09/12/1985           MRN: [id number] Visit Date: 12/27/2016              Requested by: Sandi Mariscal, [place], [place] [address] PCP: Sandi Mariscal, MD   Assessment & Plan: Visit Diagnoses:  1. Chronic low back pain without sciatica, unspecified back pain laterality     Plan: Recommend MRI to rule out structural abnormalities. Follow-up after the MRI. Out of work for 6 weeks.  Follow-Up Instructions: Return in about 2 weeks (around 01/10/2017).   Orders:  Orders Placed This Encounter  Procedures  . MR Lumbar Spine w/o contrast   Meds ordered this encounter  Medications  . gabapentin (NEURONTIN) 100 MG capsule    Sig: Take 1 capsule (100 mg total) by mouth 3 (three) times daily.    Dispense:  30 capsule    Refill:  3      Procedures: No procedures performed   Clinical Data: No additional findings.   Subjective: Chief Complaint  Patient presents with  . Lower Back - Pain, Follow-up    Patient follows up today for continued back pain. She did try to go back to work yesterday and did a lot of bending and lifting and now she is significantly worse. She tried medicines but she could not tolerate the side effects.    Review of Systems  Constitutional: Negative.   HENT: Negative.   Eyes: Negative.   Respiratory: Negative.   Cardiovascular: Negative.   Endocrine: Negative.   Musculoskeletal: Negative.   Neurological: Negative.   Hematological: Negative.   Psychiatric/Behavioral: Negative.   All other systems reviewed and are negative.    Objective: Vital Signs: There were no vitals taken for this visit.  Physical Exam  Constitutional: She is oriented to person, place, and time. She appears well-developed and well-nourished.  HENT:  Head: Normocephalic and atraumatic.  Eyes: EOM are normal.  Neck: Neck supple.  Pulmonary/Chest: Effort normal.  Abdominal: Soft.    Neurological: She is alert and oriented to person, place, and time.  Skin: Skin is warm. Capillary refill takes less than 2 seconds.  Psychiatric: She has a normal mood and affect. Her behavior is normal. Judgment and thought content normal.  Nursing note and vitals reviewed.   Ortho Exam Back exam shows tenderness palpation of the lumbar spine. Lateral hip is nontender. She does have significant weakness with knee extension. She has a decreased patellar reflex. Specialty Comments:  No specialty comments available.  Imaging: No results found.   PMFS History: Patient Active Problem List   Diagnosis Date Noted  . Common migraine 04/14/2015  . Plantar fasciitis 02/28/2015  . Hammertoe 02/28/2015   Past Medical History:  Diagnosis Date  . Common migraine 04/14/2015  . GERD (gastroesophageal reflux disease)   . IBS (irritable bowel syndrome)   . Kidney disease   . Migraine   . Morbidly obese ([place])   . Ovarian cyst     Family History  Problem Relation Age of Onset  . Migraines Mother   . Diabetes Mother   . Diabetes Father   . Diabetes Other   . Migraines Maternal Aunt   . Migraines Maternal Grandmother   . Diabetes Maternal Grandmother     Past Surgical History:  Procedure Laterality Date  . HAND SURGERY Right    ORIF  Social History   Occupational History  . [employer]   . [employer]    Social History Main Topics  . Smoking status: Former Smoker    Types: Cigarettes  . Smokeless tobacco: Never Used  . Alcohol use Yes     Comment: occasionally  . Drug use: Yes    Types: Marijuana  . Sexual activity: Not on file

## 2017-01-10 ENCOUNTER — Ambulatory Visit (INDEPENDENT_AMBULATORY_CARE_PROVIDER_SITE_OTHER): Payer: Self-pay | Admitting: Orthopaedic Surgery

## 2017-01-21 ENCOUNTER — Encounter (INDEPENDENT_AMBULATORY_CARE_PROVIDER_SITE_OTHER): Payer: Self-pay | Admitting: Orthopaedic Surgery

## 2017-01-21 ENCOUNTER — Ambulatory Visit (INDEPENDENT_AMBULATORY_CARE_PROVIDER_SITE_OTHER): Payer: BLUE CROSS/BLUE SHIELD | Admitting: Orthopaedic Surgery

## 2017-01-21 DIAGNOSIS — G8929 Other chronic pain: Secondary | ICD-10-CM | POA: Diagnosis not present

## 2017-01-21 DIAGNOSIS — M545 Low back pain, unspecified: Secondary | ICD-10-CM

## 2017-01-21 MED ORDER — GABAPENTIN 100 MG PO CAPS: 100.0000 mg | ORAL_CAPSULE | Freq: Three times a day (TID) | ORAL | 3 refills | Status: DC

## 2017-01-21 NOTE — Progress Notes (Signed)
   Office Visit Note   Patient: [name]           Date of Birth: Dec 16, 1985           MRN: [id number] Visit Date: 01/21/2017              Requested by: Sandi Mariscal, [place], [place] [address] PCP: Sandi Mariscal, MD   Assessment & Plan: Visit Diagnoses:  1. Chronic low back pain without sciatica, unspecified back pain laterality     Plan: MRI of the lumbar spine to rule out structural abnormality was reordered. Gabapentin was refilled. Follow-up with me in about 2 weeks.  Follow-Up Instructions: Return in about 2 weeks (around 02/04/2017).   Orders:  No orders of the defined types were placed in this encounter.  Meds ordered this encounter  Medications  . gabapentin (NEURONTIN) 100 MG capsule    Sig: Take 1 capsule (100 mg total) by mouth 3 (three) times daily.    Dispense:  30 capsule    Refill:  3      Procedures: No procedures performed   Clinical Data: No additional findings.   Subjective: Chief Complaint  Patient presents with  . Lower Back - Follow-up, Pain    Patient comes back today for her lumbar radiculopathy. She states overall she is doing much better since she's been resting. She continues to have pain that radiates down her left leg but this is improved slightly. Her insurance lapsed but now has been reinstated.    Review of Systems   Objective: Vital Signs: There were no vitals taken for this visit.  Physical Exam  Ortho Exam Exam is stable. Specialty Comments:  No specialty comments available.  Imaging: No results found.   PMFS History: Patient Active Problem List   Diagnosis Date Noted  . Common migraine 04/14/2015  . Plantar fasciitis 02/28/2015  . Hammertoe 02/28/2015   Past Medical History:  Diagnosis Date  . Common migraine 04/14/2015  . GERD (gastroesophageal reflux disease)   . IBS (irritable bowel syndrome)   . Kidney disease   . Migraine   . Morbidly obese ([place])   . Ovarian cyst     Family  History  Problem Relation Age of Onset  . Migraines Mother   . Diabetes Mother   . Diabetes Father   . Diabetes Other   . Migraines Maternal Aunt   . Migraines Maternal Grandmother   . Diabetes Maternal Grandmother     Past Surgical History:  Procedure Laterality Date  . HAND SURGERY Right    ORIF   Social History   Occupational History  . [employer]   . [employer]    Social History Main Topics  . Smoking status: Former Smoker    Types: Cigarettes  . Smokeless tobacco: Never Used  . Alcohol use Yes     Comment: occasionally  . Drug use: Yes    Types: Marijuana  . Sexual activity: Not on file

## 2017-01-30 ENCOUNTER — Telehealth (INDEPENDENT_AMBULATORY_CARE_PROVIDER_SITE_OTHER): Payer: Self-pay | Admitting: Orthopaedic Surgery

## 2017-01-30 NOTE — Telephone Encounter (Signed)
Records 11/30/2016 to present faxed to [facility]

## 2017-02-04 ENCOUNTER — Ambulatory Visit (INDEPENDENT_AMBULATORY_CARE_PROVIDER_SITE_OTHER): Payer: BLUE CROSS/BLUE SHIELD | Admitting: Orthopaedic Surgery

## 2017-02-07 ENCOUNTER — Ambulatory Visit
Admission: RE | Admit: 2017-02-07 | Discharge: 2017-02-07 | Disposition: A | Discharge status: Home / Self Care | Payer: BLUE CROSS/BLUE SHIELD | Source: Ambulatory Visit | Attending: Orthopaedic Surgery | Admitting: Orthopaedic Surgery

## 2017-02-07 DIAGNOSIS — M545 Low back pain, unspecified: Secondary | ICD-10-CM

## 2017-02-07 DIAGNOSIS — G8929 Other chronic pain: Secondary | ICD-10-CM

## 2017-02-11 ENCOUNTER — Encounter (INDEPENDENT_AMBULATORY_CARE_PROVIDER_SITE_OTHER): Payer: Self-pay | Admitting: Orthopaedic Surgery

## 2017-02-11 ENCOUNTER — Ambulatory Visit (INDEPENDENT_AMBULATORY_CARE_PROVIDER_SITE_OTHER): Payer: BLUE CROSS/BLUE SHIELD | Admitting: Orthopaedic Surgery

## 2017-02-11 DIAGNOSIS — M545 Low back pain, unspecified: Secondary | ICD-10-CM

## 2017-02-11 DIAGNOSIS — G8929 Other chronic pain: Secondary | ICD-10-CM

## 2017-02-11 MED ORDER — DICLOFENAC SODIUM 75 MG PO TBEC
75.0000 mg | DELAYED_RELEASE_TABLET | Freq: Two times a day (BID) | ORAL | 2 refills | Status: DC
Start: 2017-02-11 — End: 2018-05-01

## 2017-02-11 NOTE — Progress Notes (Signed)
   Office Visit Note   Patient: [name]           Date of Birth: January 24, 1986           MRN: [id number] Visit Date: 02/11/2017              Requested by: Sandi Mariscal, [place], [place] [address] PCP: Sandi Mariscal, MD   Assessment & Plan: Visit Diagnoses:  1. Chronic low back pain without sciatica, unspecified back pain laterality     Plan: MRI shows mild facet disease L4-L5 and L5-S1. She has a mild disc herniation without any stenosis at L5-S1. These results were discussed with the patient. Recommended diclofenac, physical therapy, weight loss, core strengthening. Questions encouraged and answered. Follow-up as needed.  Follow-Up Instructions: Return if symptoms worsen or fail to improve.   Orders:  No orders of the defined types were placed in this encounter.  Meds ordered this encounter  Medications  . diclofenac (VOLTAREN) 75 MG EC tablet    Sig: Take 1 tablet (75 mg total) by mouth 2 (two) times daily.    Dispense:  30 tablet    Refill:  2      Procedures: No procedures performed   Clinical Data: No additional findings.   Subjective: Chief Complaint  Patient presents with  . Lower Back - Pain, Follow-up    Patient follows up to review her MRI. Denies any numbness or tingling or new symptoms.    Review of Systems   Objective: Vital Signs: There were no vitals taken for this visit.  Physical Exam  Ortho Exam Exam stable Specialty Comments:  No specialty comments available.  Imaging: No results found.   PMFS History: Patient Active Problem List   Diagnosis Date Noted  . Common migraine 04/14/2015  . Plantar fasciitis 02/28/2015  . Hammertoe 02/28/2015   Past Medical History:  Diagnosis Date  . Common migraine 04/14/2015  . GERD (gastroesophageal reflux disease)   . IBS (irritable bowel syndrome)   . Kidney disease   . Migraine   . Morbidly obese ([place])   . Ovarian cyst     Family History  Problem Relation Age of  Onset  . Migraines Mother   . Diabetes Mother   . Diabetes Father   . Diabetes Other   . Migraines Maternal Aunt   . Migraines Maternal Grandmother   . Diabetes Maternal Grandmother     Past Surgical History:  Procedure Laterality Date  . HAND SURGERY Right    ORIF   Social History   Occupational History  . [employer]   . [employer]    Social History Main Topics  . Smoking status: Former Smoker    Types: Cigarettes  . Smokeless tobacco: Never Used  . Alcohol use Yes     Comment: occasionally  . Drug use: Yes    Types: Marijuana  . Sexual activity: Not on file

## 2017-02-27 ENCOUNTER — Emergency Department (HOSPITAL_COMMUNITY): Payer: BLUE CROSS/BLUE SHIELD

## 2017-02-27 ENCOUNTER — Telehealth (INDEPENDENT_AMBULATORY_CARE_PROVIDER_SITE_OTHER): Payer: Self-pay | Admitting: Orthopaedic Surgery

## 2017-02-27 ENCOUNTER — Emergency Department (HOSPITAL_COMMUNITY)
Admission: EM | Admit: 2017-02-27 | Discharge: 2017-02-28 | Disposition: A | Discharge status: Home / Self Care | Payer: BLUE CROSS/BLUE SHIELD | Attending: Emergency Medicine | Admitting: Emergency Medicine

## 2017-02-27 ENCOUNTER — Encounter (HOSPITAL_COMMUNITY): Payer: Self-pay | Admitting: Family Medicine

## 2017-02-27 DIAGNOSIS — S29012A Strain of muscle and tendon of back wall of thorax, initial encounter: Secondary | ICD-10-CM | POA: Insufficient documentation

## 2017-02-27 DIAGNOSIS — M546 Pain in thoracic spine: Secondary | ICD-10-CM | POA: Diagnosis present

## 2017-02-27 DIAGNOSIS — Y9301 Activity, walking, marching and hiking: Secondary | ICD-10-CM | POA: Diagnosis not present

## 2017-02-27 DIAGNOSIS — S39012A Strain of muscle, fascia and tendon of lower back, initial encounter: Secondary | ICD-10-CM

## 2017-02-27 DIAGNOSIS — Y999 Unspecified external cause status: Secondary | ICD-10-CM | POA: Diagnosis not present

## 2017-02-27 DIAGNOSIS — X58XXXA Exposure to other specified factors, initial encounter: Secondary | ICD-10-CM | POA: Diagnosis not present

## 2017-02-27 DIAGNOSIS — Y929 Unspecified place or not applicable: Secondary | ICD-10-CM | POA: Diagnosis not present

## 2017-02-27 HISTORY — DX: Spondylosis without myelopathy or radiculopathy, lumbar region: M47.816

## 2017-02-27 LAB — PREGNANCY, URINE: PREG TEST UR: NEGATIVE

## 2017-02-27 MED ORDER — KETOROLAC TROMETHAMINE 30 MG/ML IJ SOLN
30.0000 mg | Freq: Once | INTRAMUSCULAR | Status: AC
  Administered 2017-02-27: 30 mg via INTRAVENOUS
  Filled 2017-02-27: qty 1

## 2017-02-27 MED ORDER — HYDROMORPHONE HCL 1 MG/ML IJ SOLN
1.0000 mg | Freq: Once | INTRAMUSCULAR | Status: AC
  Administered 2017-02-27: 1 mg via INTRAMUSCULAR
  Filled 2017-02-27: qty 1

## 2017-02-27 MED ORDER — METHOCARBAMOL 1000 MG/10ML IJ SOLN
500.0000 mg | Freq: Once | INTRAMUSCULAR | Status: DC
  Filled 2017-02-27 (×2): qty 5

## 2017-02-27 MED ORDER — METHOCARBAMOL 500 MG PO TABS: 500.0000 mg | ORAL_TABLET | Freq: Two times a day (BID) | ORAL | 0 refills | Status: DC

## 2017-02-27 MED ORDER — IBUPROFEN 600 MG PO TABS: 600.0000 mg | ORAL_TABLET | Freq: Four times a day (QID) | ORAL | 0 refills | Status: DC | PRN

## 2017-02-27 NOTE — ED Triage Notes (Signed)
Per EMS, pt from store.  Pt had appt with chiropractor today.  Pt was walking and felt back lock up.  Lower rt back.  No injury. Vitals:  126 palp, hr 86, resp 20,

## 2017-02-27 NOTE — ED Notes (Signed)
Spoke with main pharmacy about ROBAXIN order, awaiting from main pharmacy.

## 2017-02-27 NOTE — Telephone Encounter (Signed)
Raquel Sarna from [facility] called saying that they believe patient would benefit from aquatic therapy. CB # [phone]

## 2017-02-27 NOTE — Telephone Encounter (Signed)
Yes that's fine 

## 2017-02-27 NOTE — Telephone Encounter (Signed)
Please call. Thank you. 

## 2017-02-27 NOTE — ED Provider Notes (Signed)
[facility] DEPT Provider Note   CSN: [id number] Arrival date & time: 02/27/17  1102     History   Chief Complaint Chief Complaint  Patient presents with  . Back Pain    HPI [name] is a 31 y.o. female.  The history is provided by the patient.  Back Pain   This is a recurrent problem. The current episode started 3 to 5 hours ago. The problem occurs constantly. The problem has been gradually worsening. The pain is associated with no known injury. The pain is present in the thoracic spine. The quality of the pain is described as stabbing. The pain does not radiate. The pain is severe. The symptoms are aggravated by bending, twisting and certain positions. Pertinent negatives include no chest pain, no fever, no numbness, no abdominal pain, no bladder incontinence, no dysuria, no paresthesias, no paresis, no tingling and no weakness. She has tried nothing for the symptoms. The treatment provided no relief.    Past Medical History:  Diagnosis Date  . Common migraine 04/14/2015  . Facet arthritis of lumbar region ([place])   . GERD (gastroesophageal reflux disease)   . IBS (irritable bowel syndrome)   . Kidney disease   . Migraine   . Morbidly obese ([place])   . Ovarian cyst     Patient Active Problem List   Diagnosis Date Noted  . Common migraine 04/14/2015  . Plantar fasciitis 02/28/2015  . Hammertoe 02/28/2015    Past Surgical History:  Procedure Laterality Date  . HAND SURGERY Right    ORIF    OB History    No data available       Home Medications    Prior to Admission medications   Medication Sig Start Date End Date Taking? Authorizing Provider  cyclobenzaprine (FLEXERIL) 5 MG tablet Take 1-2 tablets (5-10 mg total) by mouth 3 (three) times daily as needed for muscle spasms. Patient not taking: Reported on 02/27/2017 11/26/16   Leandrew Koyanagi, MD  diclofenac (VOLTAREN) 75 MG EC tablet Take 1 tablet (75 mg total) by mouth 2 (two) times daily. Patient not  taking: Reported on 02/27/2017 02/11/17   Leandrew Koyanagi, MD  gabapentin (NEURONTIN) 100 MG capsule Take 1 capsule (100 mg total) by mouth 3 (three) times daily. Patient not taking: Reported on 02/27/2017 01/21/17   Leandrew Koyanagi, MD  ibuprofen (ADVIL,MOTRIN) 600 MG tablet Take 1 tablet (600 mg total) by mouth every 6 (six) hours as needed for mild pain or moderate pain. 02/27/17   Forde Dandy, MD  methocarbamol (ROBAXIN) 500 MG tablet Take 1 tablet (500 mg total) by mouth 2 (two) times daily. Patient not taking: Reported on 02/27/2017 11/22/16   Muthersbaugh, Jarrett Soho, PA-C  methocarbamol (ROBAXIN) 500 MG tablet Take 1 tablet (500 mg total) by mouth 2 (two) times daily. 02/27/17   Forde Dandy, MD  naproxen (NAPROSYN) 500 MG tablet Take 1 tablet (500 mg total) by mouth 2 (two) times daily. Patient not taking: Reported on 02/27/2017 11/26/16   Leandrew Koyanagi, MD    Family History Family History  Problem Relation Age of Onset  . Migraines Mother   . Diabetes Mother   . Diabetes Father   . Diabetes Other   . Migraines Maternal Aunt   . Migraines Maternal Grandmother   . Diabetes Maternal Grandmother     Social History Social History  Substance Use Topics  . Smoking status: Former Smoker    Types: Cigarettes  . Smokeless  tobacco: Never Used  . Alcohol use No     Allergies   Penicillins   Review of Systems Review of Systems  Constitutional: Negative for fever.  Cardiovascular: Negative for chest pain.  Gastrointestinal: Negative for abdominal pain.  Genitourinary: Negative for bladder incontinence and dysuria.  Musculoskeletal: Positive for back pain.  Allergic/Immunologic: Negative for immunocompromised state.  Neurological: Negative for tingling, weakness, numbness and paresthesias.  Hematological: Does not bruise/bleed easily.  All other systems reviewed and are negative.    Physical Exam Updated Vital Signs BP (!) 96/50 (BP Location: Left Arm)   Pulse 70   Temp 98.7 F (37.1 C)  (Oral)   Resp 20   Ht 5\' 7"  (1.702 m)   Wt 120.2 kg (265 lb)   LMP 02/27/2017 Comment: neg preg test  SpO2 97%   BMI 41.50 kg/m   Physical Exam Physical Exam  Nursing note and vitals reviewed. Constitutional: Well developed, well nourished, non-toxic, and in no acute distress Head: Normocephalic and atraumatic.  Mouth/Throat: Oropharynx is clear and moist.  Neck: Normal range of motion. Neck supple.  Cardiovascular: Normal rate and regular rhythm.   Pulmonary/Chest: Effort normal and breath sounds normal.  Abdominal: Soft. There is no tenderness. There is no rebound and no guarding.  Musculoskeletal: Normal range of motion of all 4 extremities.  tenderness along the right lower thoracic paraspinal muscles  Neurological: Alert, no facial droop, fluent speech, moves all extremities symmetrically, full strength in bilateral hip flexion/extension, knee flexion/extension, ankle dorsi and plantar flexion, +2 patellar and Achilles reflexes bilaterally, sensation to light touch intact in bilateral lower extremities, full strength bilateral upper extremities Skin: Skin is warm and dry.  Psychiatric: Cooperative   ED Treatments / Results  Labs (all labs ordered are listed, but only abnormal results are displayed) Labs Reviewed  PREGNANCY, URINE    EKG  EKG Interpretation None       Radiology Dg Thoracic Spine 2 View  Result Date: 02/27/2017 CLINICAL DATA:  Pain after heavy lifting EXAM: THORACIC SPINE 3 VIEWS COMPARISON:  Chest radiograph August 19, 2015. FINDINGS: Frontal, lateral, and swimmer's views were obtained. There is no fracture or spondylolisthesis. There is no appreciable disc space narrowing. No erosive change or paraspinous lesion. IMPRESSION: No fracture or spondylolisthesis.  No appreciable arthropathy. Electronically Signed   By: Lowella Grip III M.D.   On: 02/27/2017 13:34    Procedures Procedures (including critical care time)  Medications Ordered in  ED Medications  methocarbamol (ROBAXIN) injection 500 mg (not administered)  ketorolac (TORADOL) 30 MG/ML injection 30 mg (30 mg Intravenous Given 02/27/17 1220)  HYDROmorphone (DILAUDID) injection 1 mg (1 mg Intramuscular Given 02/27/17 1222)     Initial Impression / Assessment and Plan / ED Course  I have reviewed the triage vital signs and the nursing notes.  Pertinent labs & imaging results that were available during my care of the patient were reviewed by me and considered in my medical decision making (see chart for details).     31 year old female who presents with low right thoracic back spasms. She is nontoxic and in no acute distress, but does appear comfortable. Tenderness to palpation over the right lower thoracic paraspinal muscles. Given ongoing nature of symptoms for several months now I did perform an x-ray for screening, and this shows no acute injuries of her thoracic spine. Given the reproducible nature of her symptoms, I doubt that she has a retroperitoneal process or intrathoracic process. Her neurological exam is intact. I  do not suspect acute spinal cord process. Symptoms improved with analgesia. I discussed continued supportive care management for home. She'll continue to follow-up with physical therapy and PCP.   Final Clinical Impressions(s) / ED Diagnoses   Final diagnoses:  Back strain, initial encounter    New Prescriptions New Prescriptions   IBUPROFEN (ADVIL,MOTRIN) 600 MG TABLET    Take 1 tablet (600 mg total) by mouth every 6 (six) hours as needed for mild pain or moderate pain.   METHOCARBAMOL (ROBAXIN) 500 MG TABLET    Take 1 tablet (500 mg total) by mouth 2 (two) times daily.     Forde Dandy, MD 02/27/17 [phone]

## 2017-02-27 NOTE — ED Notes (Signed)
Bed: WA06 Expected date:  Expected time:  Means of arrival:  Comments: 

## 2017-02-27 NOTE — Discharge Instructions (Signed)
Use heat packs. Take medications as prescribed. Your x-ray is reassuring. Please follow-up with your PCP and your physical therapist for ongoing management. Return for worsening symptoms, including escalating pain, unable to walk, new numbness/weakness, loss of control of bowel or urine, or any other symptoms concerning to you.

## 2017-02-27 NOTE — Telephone Encounter (Signed)
See message below °

## 2017-02-27 NOTE — Telephone Encounter (Signed)
[name] (PT) with [facility] called advised patient came in today and Savannah want to talk to Dr Erlinda Hong about the patient's progess and what's going on with the patient. The number to contact Savannah is [phone]

## 2017-02-28 NOTE — Telephone Encounter (Signed)
Called Raquel Sarna to advise

## 2017-02-28 NOTE — ED Notes (Signed)
Pain assessment must be documented within an hour of discharge. Please refer to downtime forms for proper documentation. Pain assessment entered for present time is last filed so patient charge can be discharged.

## 2017-02-28 NOTE — Telephone Encounter (Signed)
Patient came to PT clinic yesterday 9 AM. Therapist states conservative care not responding very well. Financially she is only to come once a week. Not getting what she's needing. Having Muscle spasms and ended at the hospital bc she had severe pain. Not responding well to treatment.   Therapist wanted to let you know and would like to speak to you CB [phone]

## 2017-04-10 ENCOUNTER — Telehealth (INDEPENDENT_AMBULATORY_CARE_PROVIDER_SITE_OTHER): Payer: Self-pay | Admitting: Orthopaedic Surgery

## 2017-04-10 NOTE — Telephone Encounter (Signed)
See message below °

## 2017-04-10 NOTE — Telephone Encounter (Signed)
Patient called stating that the PT is not helping and she would like to see if the Aquatic would help her.  Please call PT.  CB#[phone].  Thank you.

## 2017-04-10 NOTE — Telephone Encounter (Signed)
Sure Rx for aquatic therapy

## 2017-04-11 NOTE — Telephone Encounter (Signed)
Called pt advised her rx is ready for pick up in our office. Pt aware

## 2017-06-07 ENCOUNTER — Ambulatory Visit (INDEPENDENT_AMBULATORY_CARE_PROVIDER_SITE_OTHER): Payer: BLUE CROSS/BLUE SHIELD | Admitting: Orthopaedic Surgery

## 2017-06-17 ENCOUNTER — Ambulatory Visit (INDEPENDENT_AMBULATORY_CARE_PROVIDER_SITE_OTHER): Payer: BLUE CROSS/BLUE SHIELD | Admitting: Orthopaedic Surgery

## 2017-06-21 ENCOUNTER — Ambulatory Visit (INDEPENDENT_AMBULATORY_CARE_PROVIDER_SITE_OTHER): Payer: BLUE CROSS/BLUE SHIELD | Admitting: Orthopaedic Surgery

## 2017-06-21 ENCOUNTER — Encounter (INDEPENDENT_AMBULATORY_CARE_PROVIDER_SITE_OTHER): Payer: Self-pay | Admitting: Orthopaedic Surgery

## 2017-06-21 DIAGNOSIS — M545 Low back pain, unspecified: Secondary | ICD-10-CM

## 2017-06-21 DIAGNOSIS — G8929 Other chronic pain: Secondary | ICD-10-CM

## 2017-06-21 MED ORDER — TIZANIDINE HCL 4 MG PO TABS: 4.0000 mg | ORAL_TABLET | Freq: Four times a day (QID) | ORAL | 2 refills | Status: DC | PRN

## 2017-06-21 MED ORDER — DICLOFENAC SODIUM 75 MG PO TBEC: 75.0000 mg | DELAYED_RELEASE_TABLET | Freq: Two times a day (BID) | ORAL | 2 refills | Status: DC

## 2017-06-21 NOTE — Progress Notes (Addendum)
Office Visit Note   Patient: [name]           Date of Birth: 1985/07/25           MRN: [id number] Visit Date: 06/21/2017              Requested by: Sandi Mariscal, [place], [place] [address] PCP: Sandi Mariscal, MD   Assessment & Plan: Visit Diagnoses:  1. Chronic low back pain without sciatica, unspecified back pain laterality     Plan: Impression is continued low back pain with muscular spasms.  Prescription for Zanaflex and diclofenac.  Again we talked about the importance of weight loss but in fact she has recently gained weight.  Continue with PT versus home exercise program.  Discussed that patient may need referral to pain management clinic.  Again MRI findings were relatively unremarkable other than some mild facet disease.  Referral was made to Dr. Leafy Ro with the bariatric clinic.  Questions encouraged and answered.  Follow-up as needed. Total face to face encounter time was greater than 25 minutes and over half of this time was spent in counseling and/or coordination of care.  Follow-Up Instructions: Return if symptoms worsen or fail to improve.   Orders:  No orders of the defined types were placed in this encounter.  Meds ordered this encounter  Medications  . tiZANidine (ZANAFLEX) 4 MG tablet    Sig: Take 1 tablet (4 mg total) by mouth every 6 (six) hours as needed for muscle spasms.    Dispense:  30 tablet    Refill:  2  . diclofenac (VOLTAREN) 75 MG EC tablet    Sig: Take 1 tablet (75 mg total) by mouth 2 (two) times daily.    Dispense:  30 tablet    Refill:  2      Procedures: No procedures performed   Clinical Data: No additional findings.   Subjective: Chief Complaint  Patient presents with  . Lower Back - Pain    Patient follows up today for continued back pain.  She endorses a locking up of her back especially with prolonged sitting.  She now works at a call center.  She still denies any radicular symptoms.  Denies any  bowel bladder dysfunction or incontinence.    Review of Systems  Constitutional: Negative.   HENT: Negative.   Eyes: Negative.   Respiratory: Negative.   Cardiovascular: Negative.   Endocrine: Negative.   Musculoskeletal: Negative.   Neurological: Negative.   Hematological: Negative.   Psychiatric/Behavioral: Negative.   All other systems reviewed and are negative.    Objective: Vital Signs: There were no vitals taken for this visit.  Physical Exam  Constitutional: She is oriented to person, place, and time. She appears well-developed and well-nourished.  Pulmonary/Chest: Effort normal.  Neurological: She is alert and oriented to person, place, and time.  Skin: Skin is warm. Capillary refill takes less than 2 seconds.  Psychiatric: She has a normal mood and affect. Her behavior is normal. Judgment and thought content normal.  Nursing note and vitals reviewed.   Ortho Exam Stable exam.  No focal findings Specialty Comments:  No specialty comments available.  Imaging: No results found.   PMFS History: Patient Active Problem List   Diagnosis Date Noted  . Common migraine 04/14/2015  . Plantar fasciitis 02/28/2015  . Hammertoe 02/28/2015   Past Medical History:  Diagnosis Date  . Common migraine 04/14/2015  . Facet arthritis of lumbar region ([place])   .  GERD (gastroesophageal reflux disease)   . IBS (irritable bowel syndrome)   . Kidney disease   . Migraine   . Morbidly obese ([place])   . Ovarian cyst     Family History  Problem Relation Age of Onset  . Migraines Mother   . Diabetes Mother   . Diabetes Father   . Diabetes Other   . Migraines Maternal Aunt   . Migraines Maternal Grandmother   . Diabetes Maternal Grandmother     Past Surgical History:  Procedure Laterality Date  . HAND SURGERY Right    ORIF   Social History   Occupational History  . Occupation: [employer]  . Occupation: [employer]  Tobacco Use  . Smoking status: Former Smoker     Types: Cigarettes  . Smokeless tobacco: Never Used  Substance and Sexual Activity  . Alcohol use: No  . Drug use: No    Comment: Prior history  . Sexual activity: Not on file

## 2018-03-06 ENCOUNTER — Encounter (HOSPITAL_COMMUNITY): Payer: Self-pay | Admitting: Obstetrics and Gynecology

## 2018-03-06 ENCOUNTER — Other Ambulatory Visit: Payer: Self-pay

## 2018-03-06 ENCOUNTER — Emergency Department (HOSPITAL_COMMUNITY): Payer: 59

## 2018-03-06 ENCOUNTER — Emergency Department (HOSPITAL_COMMUNITY)
Admission: EM | Admit: 2018-03-06 | Discharge: 2018-03-06 | Disposition: A | Discharge status: Home / Self Care | Payer: 59 | Attending: Emergency Medicine | Admitting: Emergency Medicine

## 2018-03-06 DIAGNOSIS — Z87891 Personal history of nicotine dependence: Secondary | ICD-10-CM | POA: Insufficient documentation

## 2018-03-06 DIAGNOSIS — R300 Dysuria: Secondary | ICD-10-CM | POA: Diagnosis not present

## 2018-03-06 DIAGNOSIS — R0789 Other chest pain: Secondary | ICD-10-CM | POA: Diagnosis not present

## 2018-03-06 DIAGNOSIS — N183 Chronic kidney disease, stage 3 (moderate): Secondary | ICD-10-CM | POA: Insufficient documentation

## 2018-03-06 DIAGNOSIS — I129 Hypertensive chronic kidney disease with stage 1 through stage 4 chronic kidney disease, or unspecified chronic kidney disease: Secondary | ICD-10-CM | POA: Diagnosis not present

## 2018-03-06 DIAGNOSIS — R1032 Left lower quadrant pain: Secondary | ICD-10-CM | POA: Diagnosis not present

## 2018-03-06 DIAGNOSIS — R5383 Other fatigue: Secondary | ICD-10-CM | POA: Insufficient documentation

## 2018-03-06 LAB — BASIC METABOLIC PANEL
Anion gap: 9 (ref 5–15)
BUN: 12 mg/dL (ref 6–20)
CALCIUM: 9.3 mg/dL (ref 8.9–10.3)
CO2: 26 mmol/L (ref 22–32)
CREATININE: 0.92 mg/dL (ref 0.44–1.00)
Chloride: 105 mmol/L (ref 98–111)
Glucose, Bld: 135 mg/dL — ABNORMAL HIGH (ref 70–99)
Potassium: 3.9 mmol/L (ref 3.5–5.1)
SODIUM: 140 mmol/L (ref 135–145)

## 2018-03-06 LAB — URINALYSIS, ROUTINE W REFLEX MICROSCOPIC
Bilirubin Urine: NEGATIVE
Glucose, UA: NEGATIVE mg/dL
Hgb urine dipstick: NEGATIVE
KETONES UR: NEGATIVE mg/dL
Nitrite: NEGATIVE
PH: 5 (ref 5.0–8.0)
Protein, ur: NEGATIVE mg/dL
Specific Gravity, Urine: 1.024 (ref 1.005–1.030)

## 2018-03-06 LAB — I-STAT BETA HCG BLOOD, ED (MC, WL, AP ONLY)

## 2018-03-06 LAB — CBC
HCT: 41.2 % (ref 36.0–46.0)
Hemoglobin: 13.2 g/dL (ref 12.0–15.0)
MCH: 25.2 pg — ABNORMAL LOW (ref 26.0–34.0)
MCHC: 32 g/dL (ref 30.0–36.0)
MCV: 78.8 fL (ref 78.0–100.0)
PLATELETS: 510 10*3/uL — AB (ref 150–400)
RBC: 5.23 MIL/uL — AB (ref 3.87–5.11)
RDW: 14.1 % (ref 11.5–15.5)
WBC: 6.4 10*3/uL (ref 4.0–10.5)

## 2018-03-06 LAB — I-STAT TROPONIN, ED: TROPONIN I, POC: 0 ng/mL (ref 0.00–0.08)

## 2018-03-06 LAB — TSH: TSH: 2.082 u[IU]/mL (ref 0.350–4.500)

## 2018-03-06 NOTE — ED Provider Notes (Signed)
[facility] DEPT Provider Note   CSN: [id number] Arrival date & time: 03/06/18  1210     History   Chief Complaint Chief Complaint  Patient presents with  . Flank Pain  . Chest Pain  . Fatigue    HPI [name] is a 32 y.o. female.  Patient is a 32 year old female with a history of morbid obesity, stage III kidney disease, GERD, IBS who is presenting today with about a months worth of symptoms that are worsening.  She states she has diffuse chest tightness that runs down her left arm and makes her short of breath.  This comes usually when she is stressed.  It is not related to exertion.  She denies any cough or fever.  She is also noted generalized fatigue.  She is sleeping constantly and has no energy.  She is also noticed some dysuria and left flank pain.  She has not noticed any change in the color of her urine and she has no abdominal pain.  Her bowel movements have been normal.  She is taking no medications at this time.  Last saw her doctor 3 years ago.  At that time she was told her kidneys were stable but she is never followed up since.  She does not know why she had stage III kidney disease.  She has no history of diabetes or hypertension.  The history is provided by the patient.    Past Medical History:  Diagnosis Date  . Common migraine 04/14/2015  . Facet arthritis of lumbar region   . GERD (gastroesophageal reflux disease)   . IBS (irritable bowel syndrome)   . Kidney disease   . Migraine   . Morbidly obese ([place])   . Ovarian cyst     Patient Active Problem List   Diagnosis Date Noted  . Common migraine 04/14/2015  . Plantar fasciitis 02/28/2015  . Hammertoe 02/28/2015    Past Surgical History:  Procedure Laterality Date  . HAND SURGERY Right    ORIF     OB History    Gravida      Para      Term      Preterm      AB      Living  0     SAB      TAB      Ectopic      Multiple      Live Births               Home Medications    Prior to Admission medications   Medication Sig Start Date End Date Taking? Authorizing Provider  cyclobenzaprine (FLEXERIL) 5 MG tablet Take 1-2 tablets (5-10 mg total) by mouth 3 (three) times daily as needed for muscle spasms. Patient not taking: Reported on 02/27/2017 11/26/16   Leandrew Koyanagi, MD  diclofenac (VOLTAREN) 75 MG EC tablet Take 1 tablet (75 mg total) by mouth 2 (two) times daily. Patient not taking: Reported on 02/27/2017 02/11/17   Leandrew Koyanagi, MD  diclofenac (VOLTAREN) 75 MG EC tablet Take 1 tablet (75 mg total) by mouth 2 (two) times daily. Patient not taking: Reported on 03/06/2018 06/21/17   Leandrew Koyanagi, MD  gabapentin (NEURONTIN) 100 MG capsule Take 1 capsule (100 mg total) by mouth 3 (three) times daily. Patient not taking: Reported on 02/27/2017 01/21/17   Leandrew Koyanagi, MD  ibuprofen (ADVIL,MOTRIN) 600 MG tablet Take 1 tablet (600 mg total) by mouth every  6 (six) hours as needed for mild pain or moderate pain. Patient not taking: Reported on 03/06/2018 02/27/17   Forde Dandy, MD  methocarbamol (ROBAXIN) 500 MG tablet Take 1 tablet (500 mg total) by mouth 2 (two) times daily. Patient not taking: Reported on 02/27/2017 11/22/16   Muthersbaugh, Jarrett Soho, PA-C  methocarbamol (ROBAXIN) 500 MG tablet Take 1 tablet (500 mg total) by mouth 2 (two) times daily. Patient not taking: Reported on 03/06/2018 02/27/17   Forde Dandy, MD  naproxen (NAPROSYN) 500 MG tablet Take 1 tablet (500 mg total) by mouth 2 (two) times daily. Patient not taking: Reported on 02/27/2017 11/26/16   Leandrew Koyanagi, MD  tiZANidine (ZANAFLEX) 4 MG tablet Take 1 tablet (4 mg total) by mouth every 6 (six) hours as needed for muscle spasms. Patient not taking: Reported on 03/06/2018 06/21/17   Leandrew Koyanagi, MD    Family History Family History  Problem Relation Age of Onset  . Migraines Mother   . Diabetes Mother   . Diabetes Father   . Diabetes Other   . Migraines Maternal Aunt   .  Migraines Maternal Grandmother   . Diabetes Maternal Grandmother     Social History Social History   Tobacco Use  . Smoking status: Former Smoker    Types: Cigarettes  . Smokeless tobacco: Never Used  Substance Use Topics  . Alcohol use: Yes    Comment: Pt reports socially  . Drug use: No    Types: Marijuana    Comment: Prior history     Allergies   Penicillins   Review of Systems Review of Systems  All other systems reviewed and are negative.    Physical Exam Updated Vital Signs BP (!) 118/94   Pulse 81   Temp 98.2 F (36.8 C) (Oral)   Resp 17   LMP 02/16/2018 (Approximate)   SpO2 97%   Physical Exam  Constitutional: She is oriented to person, place, and time. She appears well-developed and well-nourished. No distress.  HENT:  Head: Normocephalic and atraumatic.  Mouth/Throat: Oropharynx is clear and moist.  Eyes: Pupils are equal, round, and reactive to light. Conjunctivae and EOM are normal.  Neck: Normal range of motion. Neck supple.  Cardiovascular: Normal rate, regular rhythm and intact distal pulses.  No murmur heard. Pulmonary/Chest: Effort normal and breath sounds normal. No respiratory distress. She has no wheezes. She has no rales.  Abdominal: Soft. She exhibits no distension. There is no tenderness. There is CVA tenderness. There is no rebound and no guarding.  Mild left CVA tenderness  Musculoskeletal: Normal range of motion. She exhibits no edema or tenderness.  Neurological: She is alert and oriented to person, place, and time.  Skin: Skin is warm and dry. No rash noted. No erythema.  Psychiatric: She has a normal mood and affect. Her behavior is normal.  Nursing note and vitals reviewed.    ED Treatments / Results  Labs (all labs ordered are listed, but only abnormal results are displayed) Labs Reviewed  BASIC METABOLIC PANEL - Abnormal; Notable for the following components:      Result Value   Glucose, Bld 135 (*)    All other  components within normal limits  CBC - Abnormal; Notable for the following components:   RBC 5.23 (*)    MCH 25.2 (*)    Platelets 510 (*)    All other components within normal limits  URINALYSIS, ROUTINE W REFLEX MICROSCOPIC - Abnormal; Notable for the following components:  Leukocytes, UA MODERATE (*)    Bacteria, UA RARE (*)    All other components within normal limits  URINE CULTURE  TSH  I-STAT TROPONIN, ED  I-STAT BETA HCG BLOOD, ED ([facility], [facility], [facility] ONLY)    EKG EKG Interpretation  Date/Time:  Thursday March 06 2018 12:20:35 EDT Ventricular Rate:  84 PR Interval:    QRS Duration: 85 QT Interval:  366 QTC Calculation: 433 R Axis:   97 Text Interpretation:  Sinus rhythm Borderline right axis deviation No significant change since last tracing Confirmed by Blanchie Dessert ([id number]) on 03/06/2018 12:32:41 PM   Radiology Dg Chest 2 View  Result Date: 03/06/2018 CLINICAL DATA:  32 year old female with chest pain. History of chronic kidney disease. EXAM: CHEST - 2 VIEW COMPARISON:  Chest radiographs 08/19/2015. FINDINGS: Lung volumes and mediastinal contours remain normal. There is a small calcified granuloma or vessel on end at the medial right lung base, unchanged. Visualized tracheal air column is within normal limits. No pneumothorax, pulmonary edema, pleural effusion or confluent pulmonary opacity. No acute osseous abnormality identified. There are some age advanced degenerative endplate changes in the midthoracic spine. Negative visible bowel gas pattern. IMPRESSION: No cardiopulmonary abnormality. Electronically Signed   By: Genevie Ann M.D.   On: 03/06/2018 12:47   US Renal  Result Date: 03/06/2018 CLINICAL DATA:  Left flank pain. EXAM: RENAL / URINARY TRACT ULTRASOUND COMPLETE COMPARISON:  MRI 02/07/2017.  CT report 04/24/2012. FINDINGS: Right Kidney: Length: 12.5 cm. Echogenicity within normal limits. No mass or hydronephrosis visualized. Left Kidney: Length: 11.3 cm. Echogenicity  within normal limits. No mass or hydronephrosis visualized. Bladder: Appears normal for degree of bladder distention limited exam due to patient's body habitus. IMPRESSION: No acute or focal abnormality. Electronically Signed   By: Marcello Moores  Register   On: 03/06/2018 15:46    Procedures Procedures (including critical care time)  Medications Ordered in ED Medications - No data to display   Initial Impression / Assessment and Plan / ED Course  I have reviewed the triage vital signs and the nursing notes.  Pertinent labs & imaging results that were available during my care of the patient were reviewed by me and considered in my medical decision making (see chart for details).     Patient presenting today with vague symptoms of general fatigue, left flank pain, intermittent dysuria and chest pain.  Patient's chest pain is atypical and not classic for ACS, dissection or PE.  She is PERC negative and heart score of 1.  EKG is within normal limits.  Patient does have a history of chronic kidney disease and has not been evaluated for 3 years.  She does take NSAIDs occasionally for cramps but is not on them regularly.  She is currently taking no medications.  She does deal with a lot of stress and thinks that might be part of her issue.  She denies feeling depressed and is sleeping regularly.  She has no abdominal pain at this time but does have some left flank pain.  Given the dysuria, left flank pain and known history of kidney disease we will do a renal ultrasound, UA.  CBC within normal limits, TSH and BMP are pending.  Patient's chest x-ray is within normal limits.  4:15 PM Labs are all within normal limits, x-ray and ultrasound without acute.  Low suspicion for acute cardiac abnormality.  Recommended follow-up with mental health for her ongoing anxiety and worry.  She was reassured here and discharged home.   Final  Clinical Impressions(s) / ED Diagnoses   Final diagnoses:  Atypical chest pain    Fatigue, unspecified type    ED Discharge Orders    None       Blanchie Dessert, MD 03/06/18 [phone]

## 2018-03-06 NOTE — ED Triage Notes (Signed)
Pt reports that in 2016 she was diagnosed with stage 3 kidney disease and reports she has not seen a nephrologist since 2016. Pt reports she has extreme fatigue and has not been feeling like doing normal activity lately.  Pt reports she is having arm tingling in the left arm and reports she has been having chest pain as well. Pt reports she has not had any follow up appointments in the last 2 years and is concerned about her kidneys as she is having pressure in her pelvis and more frequent urination along with the flank pain.

## 2018-03-08 LAB — URINE CULTURE

## 2018-04-07 ENCOUNTER — Emergency Department (HOSPITAL_COMMUNITY): Payer: 59

## 2018-04-07 ENCOUNTER — Other Ambulatory Visit: Payer: Self-pay

## 2018-04-07 ENCOUNTER — Encounter (HOSPITAL_COMMUNITY): Payer: Self-pay | Admitting: *Deleted

## 2018-04-07 ENCOUNTER — Emergency Department (HOSPITAL_COMMUNITY)
Admission: EM | Admit: 2018-04-07 | Discharge: 2018-04-07 | Disposition: A | Discharge status: Home / Self Care | Payer: 59 | Attending: Emergency Medicine | Admitting: Emergency Medicine

## 2018-04-07 DIAGNOSIS — R1033 Periumbilical pain: Secondary | ICD-10-CM | POA: Diagnosis present

## 2018-04-07 DIAGNOSIS — Z87891 Personal history of nicotine dependence: Secondary | ICD-10-CM | POA: Diagnosis not present

## 2018-04-07 DIAGNOSIS — K429 Umbilical hernia without obstruction or gangrene: Secondary | ICD-10-CM | POA: Diagnosis not present

## 2018-04-07 DIAGNOSIS — R110 Nausea: Secondary | ICD-10-CM

## 2018-04-07 LAB — CBC
HEMATOCRIT: 43.4 % (ref 36.0–46.0)
HEMOGLOBIN: 13.3 g/dL (ref 12.0–15.0)
MCH: 24.6 pg — AB (ref 26.0–34.0)
MCHC: 30.6 g/dL (ref 30.0–36.0)
MCV: 80.2 fL (ref 78.0–100.0)
Platelets: 442 10*3/uL — ABNORMAL HIGH (ref 150–400)
RBC: 5.41 MIL/uL — ABNORMAL HIGH (ref 3.87–5.11)
RDW: 13.7 % (ref 11.5–15.5)
WBC: 5.2 10*3/uL (ref 4.0–10.5)

## 2018-04-07 LAB — COMPREHENSIVE METABOLIC PANEL
ALBUMIN: 3.9 g/dL (ref 3.5–5.0)
ALT: 20 U/L (ref 0–44)
ANION GAP: 11 (ref 5–15)
AST: 19 U/L (ref 15–41)
Alkaline Phosphatase: 68 U/L (ref 38–126)
BILIRUBIN TOTAL: 0.5 mg/dL (ref 0.3–1.2)
BUN: 6 mg/dL (ref 6–20)
CHLORIDE: 103 mmol/L (ref 98–111)
CO2: 25 mmol/L (ref 22–32)
Calcium: 9.3 mg/dL (ref 8.9–10.3)
Creatinine, Ser: 1.17 mg/dL — ABNORMAL HIGH (ref 0.44–1.00)
GFR calc Af Amer: >60 mL/min (ref >60)
GFR calc non Af Amer: >60 mL/min (ref >60)
GLUCOSE: 121 mg/dL — AB (ref 70–99)
POTASSIUM: 4.2 mmol/L (ref 3.5–5.1)
SODIUM: 139 mmol/L (ref 135–145)
Total Protein: 8.1 g/dL (ref 6.5–8.1)

## 2018-04-07 LAB — URINALYSIS, ROUTINE W REFLEX MICROSCOPIC
BACTERIA UA: NONE SEEN
Bilirubin Urine: NEGATIVE
Glucose, UA: NEGATIVE mg/dL
HGB URINE DIPSTICK: NEGATIVE
Ketones, ur: 80 mg/dL — AB
NITRITE: NEGATIVE
Protein, ur: NEGATIVE mg/dL
SPECIFIC GRAVITY, URINE: 1.029 (ref 1.005–1.030)
pH: 6 (ref 5.0–8.0)

## 2018-04-07 LAB — LIPASE, BLOOD: Lipase: 40 U/L (ref 11–51)

## 2018-04-07 LAB — I-STAT BETA HCG BLOOD, ED (MC, WL, AP ONLY)

## 2018-04-07 MED ORDER — DICYCLOMINE HCL 20 MG PO TABS: 20.0000 mg | ORAL_TABLET | Freq: Two times a day (BID) | ORAL | 0 refills | Status: DC

## 2018-04-07 MED ORDER — ONDANSETRON HCL 4 MG/2ML IJ SOLN
4.0000 mg | Freq: Once | INTRAMUSCULAR | Status: AC
  Administered 2018-04-07: 4 mg via INTRAVENOUS
  Filled 2018-04-07: qty 2

## 2018-04-07 MED ORDER — SODIUM CHLORIDE 0.9 % IV BOLUS
2000.0000 mL | Freq: Once | INTRAVENOUS | Status: AC
  Administered 2018-04-07: 2000 mL via INTRAVENOUS

## 2018-04-07 MED ORDER — ONDANSETRON 4 MG PO TBDP: ORAL_TABLET | ORAL | 0 refills | Status: DC

## 2018-04-07 MED ORDER — GI COCKTAIL ~~LOC~~
30.0000 mL | Freq: Once | ORAL | Status: AC
  Administered 2018-04-07: 30 mL via ORAL
  Filled 2018-04-07: qty 30

## 2018-04-07 MED ORDER — MORPHINE SULFATE (PF) 4 MG/ML IV SOLN
4.0000 mg | Freq: Once | INTRAVENOUS | Status: AC
  Administered 2018-04-07: 4 mg via INTRAVENOUS
  Filled 2018-04-07: qty 1

## 2018-04-07 MED ORDER — IOHEXOL 300 MG/ML  SOLN
100.0000 mL | Freq: Once | INTRAMUSCULAR | Status: AC | PRN
  Administered 2018-04-07: 100 mL via INTRAVENOUS

## 2018-04-07 NOTE — Discharge Instructions (Addendum)
Evaluation today is reassuring and CT scan shows no evidence of obstruction.  Your lab work overall looks good.  I would encourage you to use Zofran as needed for nausea, Pepcid twice daily 30 to 60 minutes before breakfast and bedtime, and Bentyl as needed for spasm or pain.  Start with clear liquids and advance her diet slowly as tolerated, recommend small frequent meals rather than other portions.  Follow-up this week with endoscopy on Wednesday and appointment with surgeon on Thursday.  Return to the emergency department for significantly worsening abdominal pain around your hernia, persistent vomiting, fevers, if you are not able to pass stools or gas or any other new or concerning symptoms.

## 2018-04-07 NOTE — ED Triage Notes (Signed)
Pt in c/o upper abdominal pain that is worse after eating, sent in for evaluation of a hernia, pt has not really eaten since Saturday due to discomfort, reports nausea but denies vomiting, no distress noted

## 2018-04-07 NOTE — ED Provider Notes (Signed)
[facility] EMERGENCY DEPARTMENT Provider Note   CSN: [id number] Arrival date & time: 04/07/18  1335     History   Chief Complaint Chief Complaint  Patient presents with  . Nausea  . Abdominal Pain    HPI [name] is a 32 y.o. female.  [name] is a 32 y.o. Female with a history of GERD, IBS, migraines, kidney disease, and obesity, who presents to the emergency department for evaluation of periumbilical abdominal pain that is worse after eating, associated with nausea and decreased appetite.  Patient reports she has been followed by digestive health specialist in [place], and last week was told she has an umbilical hernia.  She reports that they are also planning to do an endoscopy in 2 days, and she has an outpatient consultation appointment with a surgeon in [place].  She reports over the weekend she has been unable to eat or drink anything due to severe nausea, and when she called her office regarding this they recommended she present to the emergency department for reevaluation.  She reports worsening pain around her hernia site.  She is continued to be able to pass stools and gas without difficulty.  She denies history of previous abdominal surgeries.  She has had long history of GERD, at one point was taking pantoprazole but stopped taking this because it made her feel weird, she was prescribed Bentyl by her GI doctor last week but has not filled this or taking it and has not tried anything else to treat her symptoms prior to arrival.  She reports she feels full quickly but has not had any vomiting with her symptoms over the weekend and today.     Past Medical History:  Diagnosis Date  . Common migraine 04/14/2015  . Facet arthritis of lumbar region   . GERD (gastroesophageal reflux disease)   . IBS (irritable bowel syndrome)   . Kidney disease   . Migraine   . Morbidly obese ([place])   . Ovarian cyst     Patient Active Problem  List   Diagnosis Date Noted  . Common migraine 04/14/2015  . Plantar fasciitis 02/28/2015  . Hammertoe 02/28/2015    Past Surgical History:  Procedure Laterality Date  . HAND SURGERY Right    ORIF     OB History    Gravida      Para      Term      Preterm      AB      Living  0     SAB      TAB      Ectopic      Multiple      Live Births               Home Medications    Prior to Admission medications   Medication Sig Start Date End Date Taking? Authorizing Provider  cyclobenzaprine (FLEXERIL) 5 MG tablet Take 1-2 tablets (5-10 mg total) by mouth 3 (three) times daily as needed for muscle spasms. Patient not taking: Reported on 02/27/2017 11/26/16   Leandrew Koyanagi, MD  diclofenac (VOLTAREN) 75 MG EC tablet Take 1 tablet (75 mg total) by mouth 2 (two) times daily. Patient not taking: Reported on 02/27/2017 02/11/17   Leandrew Koyanagi, MD  diclofenac (VOLTAREN) 75 MG EC tablet Take 1 tablet (75 mg total) by mouth 2 (two) times daily. Patient not taking: Reported on 03/06/2018 06/21/17   Leandrew Koyanagi, MD  dicyclomine (BENTYL) 20 MG tablet Take 1 tablet (20 mg total) by mouth 2 (two) times daily. 04/07/18   Jacqlyn Larsen, PA-C  gabapentin (NEURONTIN) 100 MG capsule Take 1 capsule (100 mg total) by mouth 3 (three) times daily. Patient not taking: Reported on 02/27/2017 01/21/17   Leandrew Koyanagi, MD  ibuprofen (ADVIL,MOTRIN) 600 MG tablet Take 1 tablet (600 mg total) by mouth every 6 (six) hours as needed for mild pain or moderate pain. Patient not taking: Reported on 03/06/2018 02/27/17   Forde Dandy, MD  methocarbamol (ROBAXIN) 500 MG tablet Take 1 tablet (500 mg total) by mouth 2 (two) times daily. Patient not taking: Reported on 02/27/2017 11/22/16   Muthersbaugh, Jarrett Soho, PA-C  methocarbamol (ROBAXIN) 500 MG tablet Take 1 tablet (500 mg total) by mouth 2 (two) times daily. Patient not taking: Reported on 03/06/2018 02/27/17   Forde Dandy, MD  naproxen (NAPROSYN) 500 MG tablet  Take 1 tablet (500 mg total) by mouth 2 (two) times daily. Patient not taking: Reported on 02/27/2017 11/26/16   Leandrew Koyanagi, MD  ondansetron [facility] ODT) 4 MG disintegrating tablet 4mg  ODT q4 hours prn nausea/vomit 04/07/18   Jacqlyn Larsen, PA-C  tiZANidine (ZANAFLEX) 4 MG tablet Take 1 tablet (4 mg total) by mouth every 6 (six) hours as needed for muscle spasms. Patient not taking: Reported on 03/06/2018 06/21/17   Leandrew Koyanagi, MD    Family History Family History  Problem Relation Age of Onset  . Migraines Mother   . Diabetes Mother   . Diabetes Father   . Diabetes Other   . Migraines Maternal Aunt   . Migraines Maternal Grandmother   . Diabetes Maternal Grandmother     Social History Social History   Tobacco Use  . Smoking status: Former Smoker    Types: Cigarettes  . Smokeless tobacco: Never Used  Substance Use Topics  . Alcohol use: Yes    Comment: Pt reports socially  . Drug use: No    Types: Marijuana    Comment: Prior history     Allergies   Penicillins   Review of Systems Review of Systems  Constitutional: Negative for chills and fever.  HENT: Negative.   Eyes: Negative for visual disturbance.  Respiratory: Negative for cough and shortness of breath.   Cardiovascular: Negative for chest pain.  Gastrointestinal: Positive for abdominal pain and nausea. Negative for blood in stool, constipation, diarrhea and vomiting.  Genitourinary: Negative for dysuria and frequency.  Musculoskeletal: Negative for arthralgias, back pain and myalgias.  Skin: Negative for color change and rash.  Neurological: Negative for dizziness, syncope and light-headedness.     Physical Exam Updated Vital Signs BP (!) 102/56 (BP Location: Right Arm)   Pulse 65   Temp 98.1 F (36.7 C) (Oral)   Resp 16   SpO2 100%   Physical Exam  Constitutional: She is oriented to person, place, and time. She appears well-developed and well-nourished.  Non-toxic appearance. She does not appear  ill. No distress.  HENT:  Head: Normocephalic and atraumatic.  Mouth/Throat: Oropharynx is clear and moist.  Eyes: Pupils are equal, round, and reactive to light. EOM are normal. Right eye exhibits no discharge. Left eye exhibits no discharge.  Cardiovascular: Normal rate, regular rhythm, normal heart sounds and intact distal pulses.  Pulmonary/Chest: Effort normal and breath sounds normal. No respiratory distress.  Respirations equal and unlabored, patient able to speak in full sentences, lungs clear to auscultation bilaterally  Abdominal: Normal  appearance and bowel sounds are normal. There is tenderness in the periumbilical area. There is guarding. There is no rigidity, no rebound and no CVA tenderness.  Abdomen is soft, nondistended, bowel sounds present throughout, no obvious hernia, likely due to body habitus, but patient does have tenderness just above the umbilicus with guarding, all other quadrants with minimal tenderness without guarding, no CVA tenderness bilaterally  Neurological: She is alert and oriented to person, place, and time. Coordination normal.  Skin: Skin is warm and dry. Capillary refill takes less than 2 seconds. She is not diaphoretic.  Psychiatric: She has a normal mood and affect. Her behavior is normal.  Nursing note and vitals reviewed.    ED Treatments / Results  Labs (all labs ordered are listed, but only abnormal results are displayed) Labs Reviewed  COMPREHENSIVE METABOLIC PANEL - Abnormal; Notable for the following components:      Result Value   Glucose, Bld 121 (*)    Creatinine, Ser 1.17 (*)    All other components within normal limits  CBC - Abnormal; Notable for the following components:   RBC 5.41 (*)    MCH 24.6 (*)    Platelets 442 (*)    All other components within normal limits  URINALYSIS, ROUTINE W REFLEX MICROSCOPIC - Abnormal; Notable for the following components:   APPearance HAZY (*)    Ketones, ur 80 (*)    Leukocytes, UA MODERATE  (*)    All other components within normal limits  LIPASE, BLOOD  I-STAT BETA HCG BLOOD, ED ([facility], [facility], [facility] ONLY)    EKG None  Radiology Ct Abdomen Pelvis W Contrast  Result Date: 04/07/2018 CLINICAL DATA:  Upper abdominal pain worse after eating. Evaluate hernia. Patient has not really eaten since Saturday due to discomfort. Patient reports nausea but denies vomiting. EXAM: CT ABDOMEN AND PELVIS WITH CONTRAST TECHNIQUE: Multidetector CT imaging of the abdomen and pelvis was performed using the standard protocol following bolus administration of intravenous contrast. CONTRAST:  115mL OMNIPAQUE IOHEXOL 300 MG/ML  SOLN COMPARISON:  Report from 04/24/2012 CT FINDINGS: Lower chest: No acute abnormality. Hepatobiliary: Mild fatty infiltration along the falciform. No space-occupying mass of the liver. Normal gallbladder without stones. No biliary dilatation. Pancreas: Normal Spleen: Normal Adrenals/Urinary Tract: Normal bilateral adrenal glands. Symmetric cortical enhancement of both kidneys without enhancing renal lesions. No nephrolithiasis nor obstructive uropathy. The urinary bladder is unremarkable without focal mural thickening or calculus. Stomach/Bowel: Stomach is within normal limits. Appendix appears normal. No evidence of bowel wall thickening, distention, or inflammatory changes. Vascular/Lymphatic: No significant vascular findings are present. No enlarged abdominal or pelvic lymph nodes. Reproductive: The uterus is unremarkable. Small follicles are noted within both ovaries with probable small corpus luteal cyst on the right. Other: Periumbilical hernia containing a short segment of nonincarcerated small bowel and omental fat is identified. This is not resulting in small-bowel obstruction. The mouth of the hernia is approximately 3.5 cm in diameter. Musculoskeletal: No acute or significant osseous findings. IMPRESSION: Periumbilical hernia containing a short segment of nonincarcerated small bowel and  omental fat. The mouth of the hernia measures approximately 3.5 cm in diameter. Electronically Signed   By: Ashley Royalty M.D.   On: 04/07/2018 20:33    Procedures Procedures (including critical care time)  Medications Ordered in ED Medications  ondansetron (ZOFRAN) injection 4 mg (4 mg Intravenous Given 04/07/18 1844)  sodium chloride 0.9 % bolus 2,000 mL (0 mLs Intravenous Stopped 04/07/18 2015)  morphine 4 MG/ML injection 4 mg (  4 mg Intravenous Given 04/07/18 1844)  iohexol (OMNIPAQUE) 300 MG/ML solution 100 mL (100 mLs Intravenous Contrast Given 04/07/18 1956)  gi cocktail (Maalox,Lidocaine,Donnatal) (30 mLs Oral Given 04/07/18 2126)     Initial Impression / Assessment and Plan / ED Course  I have reviewed the triage vital signs and the nursing notes.  Pertinent labs & imaging results that were available during my care of the patient were reviewed by me and considered in my medical decision making (see chart for details).  Patient presents for evaluation of periumbilical abdominal pain, decreased appetite and nausea.  On arrival afebrile with normal vitals and in no acute distress.  Patient was recently diagnosed with an umbilical hernia and is being followed by her GI doctor in [place].  Over the weekend she had worsening abdominal pain, decreased appetite and nausea, no vomiting.  She is still passing stools and flatulence without difficulty.  She has not taken anything to manage her symptoms.  On exam she has tenderness just above the umbilicus, there is no obvious palpable hernia, but this may be due to patient's body habitus, she has some mild tenderness without guarding throughout.  Will get abdominal labs and CT abdomen pelvis to rule out bowel obstruction.  IV fluids, Zofran and morphine.  Patient also has history of GERD with upcoming EGD has not been taking any medications to manage the symptoms that I feel this is likely also contributing to her nausea and decreased  appetite.  Labs overall reassuring, no leukocytosis and normal hemoglobin, no acute electrolyte derangements, creatinine is slightly elevated at one-point 1/7 is likely in the setting of dehydration, 2 L IV fluid given.  Normal LFTs and lipase.  Urinalysis with moderate leukocytes but patient is not having any urinary symptoms and there are squamous cells present I feel this is more likely due to contamination.  CT shows umbilical hernia containing fat and small amount of small bowel, no evidence of obstruction, 3.5 cm opening to hernia.  Patient is tolerating p.o. here in the emergency department reports significant improvement in her symptoms.  Given that we have ruled out obstruction and patient is not having any active vomiting here I feel she is stable for follow-up as planned by her GI doctor later this week.  She will have a EGD on Wednesday, and has an appointment outpatient for consultation with general surgery in [place].  Will prescribe Zofran and Bentyl, encouraged twice daily Pepcid as well.  Discussed strict return precautions.  Patient expresses understanding and is in agreement with plan, stable for discharge home.  Final Clinical Impressions(s) / ED Diagnoses   Final diagnoses:  Periumbilical abdominal pain  Umbilical hernia without obstruction and without gangrene  Nausea    ED Discharge Orders         Ordered    ondansetron (ZOFRAN ODT) 4 MG disintegrating tablet     04/07/18 2053    dicyclomine (BENTYL) 20 MG tablet  2 times daily     04/07/18 2053           Jacqlyn Larsen, Hershal Coria 04/07/18 2156    Pattricia Boss, MD 04/08/18 1810

## 2018-04-23 ENCOUNTER — Ambulatory Visit (INDEPENDENT_AMBULATORY_CARE_PROVIDER_SITE_OTHER): Payer: 59 | Admitting: Medical

## 2018-04-23 ENCOUNTER — Encounter: Payer: Self-pay | Admitting: Medical

## 2018-04-23 VITALS — BP 112/77 | HR 84 | Temp 98.2°F | Resp 16 | Ht 67.0 in | Wt 283.4 lb

## 2018-04-23 DIAGNOSIS — R739 Hyperglycemia, unspecified: Secondary | ICD-10-CM

## 2018-04-23 DIAGNOSIS — R1013 Epigastric pain: Secondary | ICD-10-CM

## 2018-04-23 MED ORDER — RANITIDINE HCL 150 MG PO CAPS: 150.0000 mg | ORAL_CAPSULE | Freq: Two times a day (BID) | ORAL | 0 refills | Status: DC

## 2018-04-23 MED ORDER — DICYCLOMINE HCL 20 MG PO TABS: 20.0000 mg | ORAL_TABLET | Freq: Three times a day (TID) | ORAL | 0 refills | Status: DC

## 2018-04-23 MED ORDER — ONDANSETRON 8 MG PO TBDP: 8.0000 mg | ORAL_TABLET | Freq: Three times a day (TID) | ORAL | 0 refills | Status: DC | PRN

## 2018-04-23 NOTE — Patient Instructions (Signed)
For your recent chronic epigastric pain with nausea and early satiety, I am prescribing Zantac, Zofran and Bentyl.  Zantac and Zofran or for your reflux type symptoms.  Bentyl is  for your history of IBS.  I want you to concentrate on eating small meals and eating to 5 times a day if needed rather than 3 large regular meals.  Some of your symptoms seem to be potentially gastroparesis-like.  In addition concerned that she might need some studies to evaluate functioning of your gallbladder.  With your known umbilical hernia, I need to advise you that if you have severe pain in the area with worsening signs or symptoms then be evaluated at the emergency department.  Potential complications with hernias as we discussed.  Presently eat bland/healthy diet.  Avoid all fried foods and greasy foods.    Labs scheduled tomorrow at 9 AM.  These will include CBC, CMP, amylase, lipase, H. pylori breath test, and A1c.  Follow-up in 7 to 10 days or as needed.  Referral to gastroenterologist in our building place.  Hopefully will get you in before your surgery for umbilical hernia.

## 2018-04-23 NOTE — Progress Notes (Signed)
Subjective:    Patient ID: [name], female    DOB: 02/03/1986, 32 y.o.   MRN: [id number]  HPI  Pt in for first time.  Pt states she has fullness of abdomen discomfort with feeling of getting  full  Very easily. She states it does not matter what she eats. She has some reflux that seems to have flared as well. Pt saw her GI about 2 months ago and gave pantoprazole(she states made her worse so she stopped). Symptoms did not improve per pt. Pt states symptoms started January or February 2019. Pt expresses she wants to see different GI MD.    During the work up for abdomen pain she was found to have umbilical hernia. She is going to have surgery for umbilical hernia on Oct 35,7017. Pt has been told umbilical hernia may help her symptoms.  Pt GI MD did egd. IMPRESSIONS and PLAN:  Mild antral gastritis. Previous biopsies were negative for H. Pylori  I suspect the patient's abdominal pain is likely due to functional dyspepsia and irritable bowel syndrome. We will treat this with doxepin 10 mg q.h.s.  Continue pantoprazole 40mg  daily  Follow up in about 2 months (around 06/09/2018).  Pt US showed normal gallbladder.   LMP- 04-15-2018.  Pt sugars have been elevated in past. But no a1c done in past.  She has some decreased kidney function. Sees nephrologist.  Pt does have hx of ibs and colon polyps.   Review of Systems  Constitutional: Negative for chills and fever.  HENT: Negative for congestion.   Respiratory: Negative for cough, chest tightness, shortness of breath and wheezing.   Cardiovascular: Negative for chest pain and palpitations.  Gastrointestinal: Positive for abdominal pain and nausea. Negative for blood in stool, constipation, diarrhea and vomiting.       Some reflux easily. Belching easily.     Endocrine: Negative for polydipsia, polyphagia and polyuria.  Musculoskeletal: Negative for gait problem.  Skin: Negative for rash.  Neurological: Negative for  dizziness, seizures, weakness and light-headedness.  Hematological: Negative for adenopathy.  Psychiatric/Behavioral: Negative for behavioral problems, confusion, decreased concentration and hallucinations. The patient is not nervous/anxious.     Past Medical History:  Diagnosis Date  . Common migraine 04/14/2015  . Facet arthritis of lumbar region   . GERD (gastroesophageal reflux disease)   . IBS (irritable bowel syndrome)   . Kidney disease   . Migraine   . Morbidly obese ([place])   . Ovarian cyst      Social History   Socioeconomic History  . Marital status: Single    Spouse name: Not on file  . Number of children: 0  . Years of education: 17-18  . Highest education level: Not on file  Occupational History  . Occupation: [employer]  . Occupation: [employer]  Social Needs  . Financial resource strain: Not on file  . Food insecurity:    Worry: Not on file    Inability: Not on file  . Transportation needs:    Medical: Not on file    Non-medical: Not on file  Tobacco Use  . Smoking status: Former Smoker    Types: Cigarettes  . Smokeless tobacco: Never Used  Substance and Sexual Activity  . Alcohol use: Not on file    Comment: Pt reports socially  . Drug use: No    Types: Marijuana    Comment: Prior history  . Sexual activity: Not Currently  Lifestyle  . Physical activity:  Days per week: Not on file    Minutes per session: Not on file  . Stress: Not on file  Relationships  . Social connections:    Talks on phone: Not on file    Gets together: Not on file    Attends religious service: Not on file    Active member of club or organization: Not on file    Attends meetings of clubs or organizations: Not on file    Relationship status: Not on file  . Intimate partner violence:    Fear of current or ex partner: Not on file    Emotionally abused: Not on file    Physically abused: Not on file    Forced sexual activity: Not on file  Other Topics Concern  .  Not on file  Social History Narrative   Patient drinks caffeine about once or twice a week.    Patient is right handed.     Past Surgical History:  Procedure Laterality Date  . HAND SURGERY Right    ORIF  . HAND SURGERY Right     Family History  Problem Relation Age of Onset  . Migraines Mother   . Diabetes Mother   . Diabetes Father   . Diabetes Other   . Migraines Maternal Aunt   . Migraines Maternal Grandmother   . Diabetes Maternal Grandmother     Allergies  Allergen Reactions  . Pantoprazole Sodium Other (See Comments)    Shaky, jittery  . Penicillins Hives    Has patient had a PCN reaction causing immediate rash, facial/tongue/throat swelling, SOB or lightheadedness with hypotension: yes Has patient had a PCN reaction causing severe rash involving mucus membranes or skin necrosis: No Has patient had a PCN reaction that required hospitalization no Has patient had a PCN reaction occurring within the last 10 years: Yes If all of the above answers are "NO", then may proceed with Cephalosporin use.     Current Outpatient Medications on File Prior to Visit  Medication Sig Dispense Refill  . cyclobenzaprine (FLEXERIL) 5 MG tablet Take 1-2 tablets (5-10 mg total) by mouth 3 (three) times daily as needed for muscle spasms. (Patient not taking: Reported on 02/27/2017) 30 tablet 3  . diclofenac (VOLTAREN) 75 MG EC tablet Take 1 tablet (75 mg total) by mouth 2 (two) times daily. (Patient not taking: Reported on 02/27/2017) 30 tablet 2  . diclofenac (VOLTAREN) 75 MG EC tablet Take 1 tablet (75 mg total) by mouth 2 (two) times daily. (Patient not taking: Reported on 03/06/2018) 30 tablet 2  . dicyclomine (BENTYL) 20 MG tablet Take 1 tablet (20 mg total) by mouth 2 (two) times daily. 20 tablet 0  . gabapentin (NEURONTIN) 100 MG capsule Take 1 capsule (100 mg total) by mouth 3 (three) times daily. (Patient not taking: Reported on 02/27/2017) 30 capsule 3  . ibuprofen (ADVIL,MOTRIN) 600 MG  tablet Take 1 tablet (600 mg total) by mouth every 6 (six) hours as needed for mild pain or moderate pain. (Patient not taking: Reported on 03/06/2018) 30 tablet 0  . methocarbamol (ROBAXIN) 500 MG tablet Take 1 tablet (500 mg total) by mouth 2 (two) times daily. (Patient not taking: Reported on 02/27/2017) 20 tablet 0  . methocarbamol (ROBAXIN) 500 MG tablet Take 1 tablet (500 mg total) by mouth 2 (two) times daily. (Patient not taking: Reported on 03/06/2018) 20 tablet 0  . naproxen (NAPROSYN) 500 MG tablet Take 1 tablet (500 mg total) by mouth 2 (two) times daily. (  Patient not taking: Reported on 02/27/2017) 60 tablet 0  . ondansetron (ZOFRAN ODT) 4 MG disintegrating tablet 4mg  ODT q4 hours prn nausea/vomit 10 tablet 0  . tiZANidine (ZANAFLEX) 4 MG tablet Take 1 tablet (4 mg total) by mouth every 6 (six) hours as needed for muscle spasms. (Patient not taking: Reported on 03/06/2018) 30 tablet 2   No current facility-administered medications on file prior to visit.     BP 112/77   Pulse 84   Temp 98.2 F (36.8 C) (Oral)   Resp 16   Ht 5\' 7"  (1.702 m)   Wt 283 lb 6.4 oz (128.5 kg)   SpO2 100%   BMI 44.39 kg/m       Objective:   Physical Exam  General Mental Status- Alert. General Appearance- Not in acute distress.   Skin General: Color- Normal Color. Moisture- Normal Moisture.  Neck Carotid Arteries- Normal color. Moisture- Normal Moisture. No carotid bruits. No JVD.  Chest and Lung Exam Auscultation: Breath Sounds:-Normal.  Cardiovascular Auscultation:Rythm- Regular. Murmurs & Other Heart Sounds:Auscultation of the heart reveals- No Murmurs.  Abdomen Inspection:-Inspeection Normal. Palpation/Percussion:Note:No mass. Palpation and Percussion of the abdomen reveal- moderate epigastric  Tender(mild diffuse lower abdomen tenderness as well. But no rlq region pain., Non Distended + BS, no rebound or guarding. No obvious bulge/hernia seen at [facility] area.      Neurologic Cranial Nerve exam:- CN III-XII intact(No nystagmus), symmetric smile. Strength:- 5/5 equal and symmetric strength both upper and lower extremities.      Assessment & Plan:             For your recent chronic epigastric pain with nausea and early satiety, I am prescribing Zantac, Zofran and Bentyl.  Zantac and Zofran or for your reflux type symptoms.  Bentyl is  for your history of IBS.  I want you to concentrate on eating small meals and eating to 5 times a day if needed rather than 3 large regular meals.  Some of your symptoms seem to be potentially gastroparesis-like.  In addition concerned that she might need some studies to evaluate functioning of your gallbladder.  With your known umbilical hernia, I need to advise you that if you have severe pain in the area with worsening signs or symptoms then be evaluated at the emergency department.  Potential complications with hernias as we discussed.  Presently eat bland/healthy diet.  Avoid all fried foods and greasy foods.    Labs scheduled tomorrow at 9 AM.  These will include CBC, CMP, amylase, lipase, H. pylori breath test, and A1c.  Follow-up in 7 to 10 days or as needed.  Referral to gastroenterologist in our building place.  Hopefully will get you in before your surgery for umbilical hernia.    45 minutes spent with pt.50% of time counseling on potential differential dx, work up I wil dol, potential treatment depending on what GI might find as well. Coordination of care done and educated on signs/symptoms of hernia complications.

## 2018-04-24 ENCOUNTER — Other Ambulatory Visit (INDEPENDENT_AMBULATORY_CARE_PROVIDER_SITE_OTHER): Payer: 59

## 2018-04-24 ENCOUNTER — Telehealth: Payer: Self-pay | Admitting: Medical

## 2018-04-24 DIAGNOSIS — R1013 Epigastric pain: Secondary | ICD-10-CM | POA: Diagnosis not present

## 2018-04-24 DIAGNOSIS — R739 Hyperglycemia, unspecified: Secondary | ICD-10-CM | POA: Diagnosis not present

## 2018-04-24 LAB — COMPREHENSIVE METABOLIC PANEL
ALK PHOS: 64 U/L (ref 39–117)
ALT: 24 U/L (ref 0–35)
AST: 18 U/L (ref 0–37)
Albumin: 3.8 g/dL (ref 3.5–5.2)
BILIRUBIN TOTAL: 0.4 mg/dL (ref 0.2–1.2)
BUN: 10 mg/dL (ref 6–23)
CALCIUM: 9.3 mg/dL (ref 8.4–10.5)
CO2: 32 mEq/L (ref 19–32)
Chloride: 103 mEq/L (ref 96–112)
Creatinine, Ser: 0.97 mg/dL (ref 0.40–1.20)
GFR: 86.18 mL/min (ref >60.00)
GLUCOSE: 153 mg/dL — AB (ref 70–99)
POTASSIUM: 4.5 meq/L (ref 3.5–5.1)
Sodium: 138 mEq/L (ref 135–145)
TOTAL PROTEIN: 7.1 g/dL (ref 6.0–8.3)

## 2018-04-24 LAB — HEMOGLOBIN A1C: Hgb A1c MFr Bld: 7.6 % — ABNORMAL HIGH (ref 4.6–6.5)

## 2018-04-24 LAB — CBC WITH DIFFERENTIAL/PLATELET
BASOS ABS: 0.1 10*3/uL (ref 0.0–0.1)
BASOS PCT: 1.9 % (ref 0.0–3.0)
EOS ABS: 0.2 10*3/uL (ref 0.0–0.7)
Eosinophils Relative: 4 % (ref 0.0–5.0)
HCT: 37.8 % (ref 36.0–46.0)
Hemoglobin: 12.1 g/dL (ref 12.0–15.0)
LYMPHS ABS: 2.7 10*3/uL (ref 0.7–4.0)
Lymphocytes Relative: 52.8 % — ABNORMAL HIGH (ref 12.0–46.0)
MCHC: 32.1 g/dL (ref 30.0–36.0)
MCV: 77.4 fl — AB (ref 78.0–100.0)
MONOS PCT: 4.8 % (ref 3.0–12.0)
Monocytes Absolute: 0.3 10*3/uL (ref 0.1–1.0)
NEUTROS ABS: 1.9 10*3/uL (ref 1.4–7.7)
NEUTROS PCT: 36.5 % — AB (ref 43.0–77.0)
PLATELETS: 449 10*3/uL — AB (ref 150.0–400.0)
RBC: 4.88 Mil/uL (ref 3.87–5.11)
RDW: 15.2 % (ref 11.5–15.5)
WBC: 5.2 10*3/uL (ref 4.0–10.5)

## 2018-04-24 LAB — LIPASE: Lipase: 33 U/L (ref 11.0–59.0)

## 2018-04-24 LAB — AMYLASE: Amylase: 40 U/L (ref 27–131)

## 2018-04-24 MED ORDER — METFORMIN HCL ER 500 MG PO TB24: ORAL_TABLET | ORAL | 0 refills | Status: DC

## 2018-04-24 NOTE — Telephone Encounter (Signed)
Rx metformin sent to pt pharmacy. 

## 2018-04-25 ENCOUNTER — Encounter: Payer: Self-pay | Admitting: Medical

## 2018-04-25 LAB — H. PYLORI BREATH TEST: H. pylori Breath Test: NOT DETECTED

## 2018-04-28 ENCOUNTER — Telehealth: Payer: Self-pay | Admitting: Medical

## 2018-04-28 NOTE — Telephone Encounter (Signed)
When is pt Gi appointment?

## 2018-04-29 ENCOUNTER — Encounter: Payer: Self-pay | Admitting: Medical

## 2018-04-29 ENCOUNTER — Telehealth: Payer: Self-pay | Admitting: Gastroenterology

## 2018-04-29 ENCOUNTER — Ambulatory Visit (INDEPENDENT_AMBULATORY_CARE_PROVIDER_SITE_OTHER): Payer: 59 | Admitting: Medical

## 2018-04-29 VITALS — BP 101/73 | HR 64 | Temp 97.8°F | Resp 16 | Ht 67.0 in | Wt 279.6 lb

## 2018-04-29 DIAGNOSIS — E119 Type 2 diabetes mellitus without complications: Secondary | ICD-10-CM

## 2018-04-29 DIAGNOSIS — R1013 Epigastric pain: Secondary | ICD-10-CM | POA: Diagnosis not present

## 2018-04-29 NOTE — Progress Notes (Signed)
Subjective:    Patient ID: [name], female    DOB: [birth year], 32 y.o.   MRN: [id number]  HPI  Pt in for follow up.  Pt states her abdomen pain features are about the same.  She still has epigastric pain and get feel real full after eating.  I have put in referral to GI but she has not been told when appointment is.    Since last visit she has not taken the zantac, zofran and bentyl.  Pt still states her umbilical hernia surgery is pending.  Pt has given me update that she has missed various days of work. Prior to Sept 24 th she had missed 10 days total. She was under care of other pcp and other specialist.   Pt also known diabetic. She has not started her metformin.   Review of Systems  Constitutional: Negative for chills, diaphoresis, fatigue and fever.  Respiratory: Negative for cough, chest tightness, shortness of breath and wheezing.   Cardiovascular: Negative for chest pain and palpitations.  Gastrointestinal: Positive for abdominal pain. Negative for blood in stool, constipation, diarrhea and nausea.  Genitourinary: Negative for decreased urine volume, difficulty urinating, dyspareunia, dysuria and genital sores.  Skin: Negative for rash.  Neurological: Negative for dizziness, syncope, speech difficulty, weakness, light-headedness and headaches.  Hematological: Negative for adenopathy. Does not bruise/bleed easily.  Psychiatric/Behavioral: Negative for behavioral problems and confusion.    Past Medical History:  Diagnosis Date  . Common migraine 04/14/2015  . Facet arthritis of lumbar region   . GERD (gastroesophageal reflux disease)   . IBS (irritable bowel syndrome)   . Kidney disease   . Migraine   . Morbidly obese ([place])   . Ovarian cyst      Social History   Socioeconomic History  . Marital status: Single    Spouse name: Not on file  . Number of children: 0  . Years of education: 17-18  . Highest education level: Not on file  Occupational  History  . Occupation: [employer]  . Occupation: [employer]  Social Needs  . Financial resource strain: Not on file  . Food insecurity:    Worry: Not on file    Inability: Not on file  . Transportation needs:    Medical: Not on file    Non-medical: Not on file  Tobacco Use  . Smoking status: Former Smoker    Types: Cigarettes  . Smokeless tobacco: Never Used  Substance and Sexual Activity  . Alcohol use: Not on file    Comment: Pt reports socially  . Drug use: No    Types: Marijuana    Comment: Prior history  . Sexual activity: Not Currently  Lifestyle  . Physical activity:    Days per week: Not on file    Minutes per session: Not on file  . Stress: Not on file  Relationships  . Social connections:    Talks on phone: Not on file    Gets together: Not on file    Attends religious service: Not on file    Active member of club or organization: Not on file    Attends meetings of clubs or organizations: Not on file    Relationship status: Not on file  . Intimate partner violence:    Fear of current or ex partner: Not on file    Emotionally abused: Not on file    Physically abused: Not on file    Forced sexual activity: Not on file  Other  Topics Concern  . Not on file  Social History Narrative   Patient drinks caffeine about once or twice a week.    Patient is right handed.     Past Surgical History:  Procedure Laterality Date  . HAND SURGERY Right    ORIF  . HAND SURGERY Right     Family History  Problem Relation Age of Onset  . Migraines Mother   . Diabetes Mother   . Diabetes Father   . Diabetes Other   . Migraines Maternal Aunt   . Migraines Maternal Grandmother   . Diabetes Maternal Grandmother     Allergies  Allergen Reactions  . Pantoprazole Sodium Other (See Comments)    Shaky, jittery  . Penicillins Hives    Has patient had a PCN reaction causing immediate rash, facial/tongue/throat swelling, SOB or lightheadedness with hypotension:  yes Has patient had a PCN reaction causing severe rash involving mucus membranes or skin necrosis: No Has patient had a PCN reaction that required hospitalization no Has patient had a PCN reaction occurring within the last 10 years: Yes If all of the above answers are "NO", then may proceed with Cephalosporin use.     Current Outpatient Medications on File Prior to Visit  Medication Sig Dispense Refill  . cyclobenzaprine (FLEXERIL) 5 MG tablet Take 1-2 tablets (5-10 mg total) by mouth 3 (three) times daily as needed for muscle spasms. 30 tablet 3  . diclofenac (VOLTAREN) 75 MG EC tablet Take 1 tablet (75 mg total) by mouth 2 (two) times daily. 30 tablet 2  . diclofenac (VOLTAREN) 75 MG EC tablet Take 1 tablet (75 mg total) by mouth 2 (two) times daily. 30 tablet 2  . dicyclomine (BENTYL) 20 MG tablet Take 1 tablet (20 mg total) by mouth 3 (three) times daily before meals. 90 tablet 0  . gabapentin (NEURONTIN) 100 MG capsule Take 1 capsule (100 mg total) by mouth 3 (three) times daily. 30 capsule 3  . ibuprofen (ADVIL,MOTRIN) 600 MG tablet Take 1 tablet (600 mg total) by mouth every 6 (six) hours as needed for mild pain or moderate pain. 30 tablet 0  . metFORMIN (GLUCOPHAGE-XR) 500 MG 24 hr tablet 1 tab po q day 30 tablet 0  . methocarbamol (ROBAXIN) 500 MG tablet Take 1 tablet (500 mg total) by mouth 2 (two) times daily. 20 tablet 0  . methocarbamol (ROBAXIN) 500 MG tablet Take 1 tablet (500 mg total) by mouth 2 (two) times daily. 20 tablet 0  . naproxen (NAPROSYN) 500 MG tablet Take 1 tablet (500 mg total) by mouth 2 (two) times daily. 60 tablet 0  . ondansetron (ZOFRAN ODT) 8 MG disintegrating tablet Take 1 tablet (8 mg total) by mouth every 8 (eight) hours as needed for nausea or vomiting. 20 tablet 0  . ranitidine (ZANTAC) 150 MG capsule Take 1 capsule (150 mg total) by mouth 2 (two) times daily. 60 capsule 0  . tiZANidine (ZANAFLEX) 4 MG tablet Take 1 tablet (4 mg total) by mouth every 6  (six) hours as needed for muscle spasms. 30 tablet 2   No current facility-administered medications on file prior to visit.     BP 101/73   Pulse 64   Temp 97.8 F (36.6 C) (Oral)   Resp 16   Ht 5\' 7"  (1.702 m)   Wt 279 lb 9.6 oz (126.8 kg)   SpO2 100%   BMI 43.79 kg/m       Objective:   Physical Exam  General Mental Status- Alert. General Appearance- Not in acute distress.   Skin General: Color- Normal Color. Moisture- Normal Moisture.  Neck Carotid Arteries- Normal color. Moisture- Normal Moisture. No carotid bruits. No JVD.  Chest and Lung Exam Auscultation: Breath Sounds:-Normal.  Cardiovascular Auscultation:Rythm- Regular. Murmurs & Other Heart Sounds:Auscultation of the heart reveals- No Murmurs.  Abdomen Inspection:-Inspeection Normal. Palpation/Percussion:Note:No mass. Palpation and Percussion of the abdomen reveal- mild-moderate epigastric and umbilical Tender, Non Distended + BS, no rebound or guarding.   Neurologic Cranial Nerve exam:- CN III-XII intact(No nystagmus), symmetric smile. Strength:- 5/5 equal and symmetric strength both upper and lower extremities.      Assessment & Plan:  For your epigastric pain, I do want you to attend upcoming gastroenterology appointment.  During the interim please start the Zantac, Zofran and Bentyl as prescribed.  Reminder that supply Zantac downstairs not on recall per pharmacist I talked with her last week.  I did write letter today asking human resources to give extension on your short-term disability paperwork as this is only the second time I have seen you and feel like we need more information for paperwork to be filled out.  You do also have the umbilical hernia that will be repaired soon.  You might ask your human resource department if you need separate paperwork filled out by the surgeon's office.  For diabetes, recommend low sugar diet and start metformin.  Remember we will start with low-dose to avoid  GI side effects.  Recommended to call your insurance company and see if they will give you free glucometer or if they have a preferred brand glucometer that they will cover.  If you get the name of the glucometer then we can write a prescription for you.  Goal would be to check your fasting blood sugars in the morning and check one time postmeal to good idea and how you are meals affect your sugar levels.  Follow-up in 2 to 3 weeks or as needed.  Mackie Pai, PA-C

## 2018-04-29 NOTE — Patient Instructions (Signed)
For your epigastric pain, I do want you to attend upcoming gastroenterology appointment.  During the interim please start the Zantac, Zofran and Bentyl as prescribed.  Reminder that supply Zantac downstairs not on recall per pharmacist I talked with her last week.  I did write letter today asking human resources to give extension on your short-term disability paperwork as this is only the second time I have seen you and feel like we need more information for paperwork to be filled out.  You do also have the umbilical hernia that will be repaired soon.  You might ask your human resource department if you need separate paperwork filled out by the surgeon's office.  For diabetes, recommend low sugar diet and start metformin.  Remember we will start with low-dose to avoid GI side effects.  Recommended to call your insurance company and see if they will give you free glucometer or if they have a preferred brand glucometer that they will cover.  If you get the name of the glucometer then we can write a prescription for you.  Goal would be to check your fasting blood sugars in the morning and check one time postmeal to good idea and how you are meals affect your sugar levels.  Follow-up in 2 to 3 weeks or as needed.

## 2018-04-29 NOTE — Telephone Encounter (Signed)
I had a chance to review her recent medical history, to include recent GI notes from September and EGD in September. Ok to schedule appt with me in clinic and will review again in detail with her. Thank you.

## 2018-04-29 NOTE — Telephone Encounter (Signed)
Pt has appt on 10/10 at 9:30 [facility]

## 2018-04-29 NOTE — Telephone Encounter (Signed)
Pt is scheduled with Dr. Cathleen Corti for 05/01/18.  Spoke w/Gwynn @ [facility] and she said she will notify the patient of this appt.

## 2018-05-01 ENCOUNTER — Ambulatory Visit (INDEPENDENT_AMBULATORY_CARE_PROVIDER_SITE_OTHER): Payer: 59 | Admitting: Gastroenterology

## 2018-05-01 ENCOUNTER — Encounter: Payer: Self-pay | Admitting: Gastroenterology

## 2018-05-01 VITALS — BP 122/80 | HR 71 | Ht 68.0 in | Wt 278.0 lb

## 2018-05-01 DIAGNOSIS — K760 Fatty (change of) liver, not elsewhere classified: Secondary | ICD-10-CM | POA: Diagnosis not present

## 2018-05-01 DIAGNOSIS — R1013 Epigastric pain: Secondary | ICD-10-CM

## 2018-05-01 DIAGNOSIS — R1084 Generalized abdominal pain: Secondary | ICD-10-CM

## 2018-05-01 DIAGNOSIS — K2970 Gastritis, unspecified, without bleeding: Secondary | ICD-10-CM

## 2018-05-01 NOTE — Patient Instructions (Signed)
If you are age 31 or older, your body mass index should be between 23-30. Your Body mass index is 42.27 kg/m. If this is out of the aforementioned range listed, please consider follow up with your Primary Care Provider.  If you are age 33 or younger, your body mass index should be between 19-25. Your Body mass index is 42.27 kg/m. If this is out of the aformentioned range listed, please consider follow up with your Primary Care Provider.   You have been given education on a Low FODMAP Diet.   It was a pleasure to see you today!  Vito Cirigliano, D.O.

## 2018-05-01 NOTE — Progress Notes (Signed)
Chief Complaint: Abdominal pain   Referring Provider:     Mackie Pai, PA-C    HPI:     [name] is a 32 y.o. female with a previous history of IBS and GERD referred to the [facility] for evaluation of abdominal pain.  She was recently seen by Dr.  Cindee Salt at [facility] in [place] in September for abdominal pain and reflux.  EGD completed and notable for non-H. pylori gastritis.  She was seen in follow-up and diagnosed with functional dyspepsia and IBS, with plan for doxepin 10 mg nightly, Bentyl as needed and continued Protonix 40 mg daily and ordered for CT abdomen pelvis (results as below) and repeat labs and follow-up in 2 months. However, she decided she would like to change her GI provider.  CT with umbilical hernia.  Seen by Dr. Arvin Collard, general surgery, with recommendation for open repair and application of mesh of umbilical hernia. Was scheduled for 10/3, but she preferred to delay until after this appt. Now scheduled for 10/18.   Today she states, she "lost trust" with prior GI.  She describes upper abdominal and MEG pain. Pain worse post PO, independent of type of foods, starting immediately after eating. Eating 1/day now due to pain. Early satiety, but no weight loss. Pain has been present since approx January 2019. No pain with fasting. Pain can last multiple hours. +nocturnal sxs. Nausea without emesis. Stools soft, but not water/loose.  No history of constipation.  Change in stools not always reliably associated with pain, and BM does not improve pain.  No overt GI blood loss. Has lost only a few lbs over last 8-9 months.   Stopped Protonix due to "shakes" and nausea. Has not taken any of the other prescribed meds. Started on Metformin this week for newly diagnosed diabetes.    Has trialed lactaid, Advil for pain. No improvement with OTC agents so stopped each of these.  No known family history of CRC, GI malignancy, liver disease,  pancreatic disease, or IBD.   Endoscopic history: -Colonoscopy (July 2013): 2 inflammatory polyps, normal TI.  Recommendation to repeat at age 50. -EGD (01/2012): Non-H. pylori gastritis -EGD (03/2018, [place]): Mild antral gastritis  - RUQ ultrasound (03/2018): Normal gallbladder, no duct dilation, fatty liver infiltration - CT (03/2018): Fatty liver, umbilical hernia containing fat and small bowel without obstruction, normal GB, pancreas, spleen.   Was seen in the ER in September 2019 with normal CBC and liver enzymes.  Past Medical History:  Diagnosis Date  . Common migraine 04/14/2015  . Facet arthritis of lumbar region   . GERD (gastroesophageal reflux disease)   . IBS (irritable bowel syndrome)   . Kidney disease   . Migraine   . Morbidly obese ([place])   . Ovarian cyst      Past Surgical History:  Procedure Laterality Date  . HAND SURGERY Right    ORIF  . HAND SURGERY Right    Family History  Problem Relation Age of Onset  . Migraines Mother   . Diabetes Mother   . Diabetes Father   . Diabetes Other   . Migraines Maternal Aunt   . Migraines Maternal Grandmother   . Diabetes Maternal Grandmother   . Colon cancer Neg Hx    Social History   Tobacco Use  . Smoking status: Former Smoker    Types: Cigarettes  . Smokeless tobacco: Never Used  Substance Use Topics  .  Alcohol use: Not on file    Comment: Pt reports socially  . Drug use: No    Types: Marijuana    Comment: Prior history   Current Outpatient Medications  Medication Sig Dispense Refill  . metFORMIN (GLUCOPHAGE-XR) 500 MG 24 hr tablet 1 tab po q day 30 tablet 0   No current facility-administered medications for this visit.    Allergies  Allergen Reactions  . Pantoprazole Sodium Other (See Comments)    Shaky, jittery  . Penicillins Hives    Has patient had a PCN reaction causing immediate rash, facial/tongue/throat swelling, SOB or lightheadedness with hypotension: yes Has patient had a PCN  reaction causing severe rash involving mucus membranes or skin necrosis: No Has patient had a PCN reaction that required hospitalization no Has patient had a PCN reaction occurring within the last 10 years: Yes If all of the above answers are "NO", then may proceed with Cephalosporin use.      Review of Systems: All systems reviewed and negative except where noted in HPI.     Physical Exam:    Wt Readings from Last 3 Encounters:  05/01/18 278 lb (126.1 kg)  04/29/18 279 lb 9.6 oz (126.8 kg)  04/23/18 283 lb 6.4 oz (128.5 kg)    BP 122/80   Pulse 71   Ht 5\' 8"  (1.727 m)   Wt 278 lb (126.1 kg)   BMI 42.27 kg/m  Constitutional:  Pleasant, in no acute distress. Psychiatric: Normal mood and affect. Behavior is normal. EENT: Pupils normal.  Conjunctivae are normal. No scleral icterus. Neck supple. No cervical LAD. Cardiovascular: Normal rate, regular rhythm. No edema Pulmonary/chest: Effort normal and breath sounds normal. No wheezing, rales or rhonchi. Abdominal: Soft, nondistended.  Mild TTP to deep palpation in upper abdomen.  No rebound or guarding.. Bowel sounds active throughout. There are no masses palpable. No hepatomegaly. Neurological: Alert and oriented to person place and time. Skin: Skin is warm and dry. No rashes noted.   ASSESSMENT AND PLAN;   [name] is a 32 y.o. female presenting with chronic upper abdominal/MEG pain, previously followed by outside GI provider.  All available notes were reviewed.  Overall her clinical presentation seems most consistent with nonulcer dyspepsia and will proceed as below:  -Discussed trial of medical management (i.e. PPI, FD guard, Bentyl, TCA), and she would like to proceed with dietary modifications without medications to start. - Start with low FODMAP diet.  Provided with patient handout. - If incomplete response, can trial FDGuard, Bentyl. Discussed TCA for neuromodulation. - Can also consider trial of alternate  high-dose PPI as she previously did not tolerate Protonix.  Again she would like to hold off on medications pending response to dietary mods as above - Given recent EGD (although unclear if repeat biopsies were actually done): No plan for repeat endoscopic evaluation at this time.  Similarly, CT and RUQ ultrasound recently done for same indication, and without acute intra-abdominal pathology.  No repeat imaging needed at this time.  Can consider H. pylori stool antigen testing. -Recommended slow weight loss through dietary modifications and exercise - RTC in 3 to 4 months or sooner as needed  2) fatty liver infiltration: CT and RUQ ultrasound both notable for fatty liver infiltration.  Otherwise normal liver enzymes.  Discussed fatty liver disease and risk of progression to NASH and cirrhosis.  Recommended weight loss 10% over weeks to months through dietary modifications and exercise.  Otherwise no clinical or serologic evidence impaired hepatic  synthetic function.  Was recently started on metformin; can alternatively consider pioglitazone  I spent a total of 45 minutes of face-to-face time with the patient. Greater than 50% of the time was spent counseling and coordinating care.    Lavena Bullion, DO, FACG  05/01/2018, 9:48 AM   [place]agu[place], Percell Miller, PA-C

## 2018-05-06 NOTE — Telephone Encounter (Signed)
Copied from [place] [id number]. Topic: Inquiry >> May 01, 2018  9:43 AM Margot Ables wrote: Reason for CRM: requesting call back with additional clinical information for pts short term disability claim. No reference #. Direct dial [phone].

## 2018-05-09 HISTORY — PX: HERNIA REPAIR: SHX51

## 2018-05-12 ENCOUNTER — Telehealth: Payer: Self-pay | Admitting: Medical

## 2018-05-12 ENCOUNTER — Telehealth: Payer: Self-pay | Admitting: *Deleted

## 2018-05-12 NOTE — Telephone Encounter (Signed)
Received FMLA/STD Concurrent disability Leave paperwork from [place]; forwarded to provider/SLS 10/21

## 2018-05-12 NOTE — Telephone Encounter (Signed)
Pt needs to have follow up appointment. She had fmla type paperwork to fill out. Need convenient time with extra time built in to appointment. Can she get scheduled this week.

## 2018-05-13 NOTE — Telephone Encounter (Signed)
Please call and schedule

## 2018-05-22 ENCOUNTER — Telehealth: Payer: Self-pay | Admitting: Medical

## 2018-05-22 ENCOUNTER — Other Ambulatory Visit: Payer: Self-pay | Admitting: Medical

## 2018-05-22 NOTE — Telephone Encounter (Signed)
Just checking on why pt not scheduled for office visit. I sent message and it appears you sent message to someone to schedule pt? I have 2 forms to fill out and she needs office visit. It will longer than usual visit. So offer her last patient in morning on Tuesday. Not sure if deadline for this paperwork but need to see her before filling out paperwork. Not sure what to put on these forms. Important factor to know is did symptoms improve after her surgery? Does she feel like can work after healing process post surgery. She needs appointment.

## 2018-05-22 NOTE — Telephone Encounter (Signed)
Called pt twice on cell phone, MB Full and could not leave message. Will try again.

## 2018-05-22 NOTE — Telephone Encounter (Signed)
Opened to review 

## 2018-05-23 MED ORDER — METFORMIN HCL ER 500 MG PO TB24: ORAL_TABLET | ORAL | 0 refills | Status: DC

## 2018-05-27 ENCOUNTER — Encounter: Payer: Self-pay | Admitting: Medical

## 2018-05-27 ENCOUNTER — Ambulatory Visit (INDEPENDENT_AMBULATORY_CARE_PROVIDER_SITE_OTHER): Payer: 59 | Admitting: Medical

## 2018-05-27 VITALS — BP 116/51 | HR 62 | Temp 98.0°F | Resp 16 | Ht 68.0 in | Wt 280.1 lb

## 2018-05-27 DIAGNOSIS — K429 Umbilical hernia without obstruction or gangrene: Secondary | ICD-10-CM | POA: Diagnosis not present

## 2018-05-27 DIAGNOSIS — Z9889 Other specified postprocedural states: Secondary | ICD-10-CM

## 2018-05-27 DIAGNOSIS — R1013 Epigastric pain: Secondary | ICD-10-CM | POA: Diagnosis not present

## 2018-05-27 DIAGNOSIS — Z8719 Personal history of other diseases of the digestive system: Secondary | ICD-10-CM

## 2018-05-27 MED ORDER — DICYCLOMINE HCL 20 MG PO TABS: 20.0000 mg | ORAL_TABLET | Freq: Three times a day (TID) | ORAL | 0 refills | Status: DC

## 2018-05-27 MED ORDER — RANITIDINE HCL 150 MG PO CAPS: 150.0000 mg | ORAL_CAPSULE | Freq: Two times a day (BID) | ORAL | 0 refills | Status: DC

## 2018-05-27 MED FILL — raNITIdine HCL 150 MG TABS: 150 | 30 days supply | Qty: 60 | Fill #0

## 2018-05-27 NOTE — Patient Instructions (Signed)
For your history of GERD/dyspepsia and umbilical hernia region pain, I did go ahead and fill out the [facility] paperwork.  You have mentioned that the dates have already passed.  Hopefully they will still except the paperwork.  It was impossible for me to fill out paperwork without knowing how you recovered from the surgery and your level of pain.  Also need to know the restrictions that you were advised post surgery.  Would recommend that you continue healthy diet GI MD advised.  Also gave you prescription of Zantac to get filled downstairs in our pharmacy.  In addition recent been to your pharmacy.  Attend surgeon appointment tomorrow for postop evaluation.  I did put your return to work date was June 02, 2018.  See if your surgeon agrees with this return date.  If he thinks you need more time off then defer to his expertise.  Follow-up in 3 weeks here or as needed.

## 2018-05-27 NOTE — Progress Notes (Signed)
Pre visit review using our clinic review tool, if applicable. No additional management support is needed unless otherwise documented below in the visit note. 

## 2018-05-27 NOTE — Progress Notes (Signed)
Subjective:    Patient ID: [name], female    DOB: 24-Sep-1985, 32 y.o.   MRN: [id number]  HPI  Pt is in for follow up.  Pt states she did have umbilical hernia repair. Pt had surgery on October 18 th. She had other GI symptoms and it was not entirely clear if some of her symptoms were from hernia or directly from the stomach.   Pt states pain over umbilical area appears to have improved alot. But she is still having epigastric pain. Example she states starchy food yesterday and still had epigastric pain.  Pt GI doctor advised  "Discussed trial of medical management (i.e. PPI, FD guard, Bentyl, TCA), and she would like to proceed with dietary modifications without medications to start. - Start with low FODMAP diet.  Provided with patient handout. - If incomplete response, can trial FDGuard, Bentyl. Discussed TCA for neuromodulation. - Can also consider trial of alternate high-dose PPI as she previously did not tolerate Protonix.  Again she would like to hold off on medications pending response to dietary mods as above - Given recent EGD (although unclear if repeat biopsies were actually done): No plan for repeat endoscopic evaluation at this time.  Similarly, CT and RUQ ultrasound recently done for same indication, and without acute intra-abdominal pathology.  No repeat imaging needed at this time.  Can consider H. pylori stool antigen testing. -Recommended slow weight loss through dietary modifications and exercise - RTC in 3 to 4 months or sooner as needed  2) fatty liver infiltration: CT and RUQ ultrasound both notable for fatty liver infiltration.  Otherwise normal liver enzymes.  Discussed fatty liver disease and risk of progression to NASH and cirrhosis.  Recommended weight loss 10% over weeks to months through dietary modifications and exercise.  Otherwise no clinical or serologic evidence impaired hepatic synthetic function.  Was recently started on metformin; can  alternatively consider pioglitazone  I spent a total of 45 minutes of face-to-face time with the patient. Greater than 50% of the time was spent counseling and coordinating care."   Pt work told her they would only cover Oct 18-29 th.(pt states paper work was due on Oct 14 th)   Pt states she had pressure over abdomen sitting was bothering her when she had hernia(before the repair). This bothered her whether she was sitting or standing.   Pt feels like she can return to work on Nov 11,2019.    Review of Systems  Constitutional: Negative for chills, fatigue and fever.  Respiratory: Negative for cough, chest tightness, shortness of breath and wheezing.   Cardiovascular: Negative for chest pain.  Gastrointestinal: Positive for abdominal pain. Negative for abdominal distention, blood in stool, diarrhea, nausea and vomiting.  Musculoskeletal: Negative for back pain.  Skin: Negative for rash.  Neurological: Negative for dizziness, seizures, speech difficulty, weakness and headaches.  Hematological: Negative for adenopathy. Does not bruise/bleed easily.  Psychiatric/Behavioral: Negative for behavioral problems and confusion.    Past Medical History:  Diagnosis Date  . Common migraine 04/14/2015  . Facet arthritis of lumbar region   . GERD (gastroesophageal reflux disease)   . IBS (irritable bowel syndrome)   . Kidney disease   . Migraine   . Morbidly obese ([place])   . Ovarian cyst      Social History   Socioeconomic History  . Marital status: Single    Spouse name: Not on file  . Number of children: 0  . Years of education: 17-18  .  Highest education level: Not on file  Occupational History  . Occupation: [employer]  . Occupation: [employer]  Social Needs  . Financial resource strain: Not on file  . Food insecurity:    Worry: Not on file    Inability: Not on file  . Transportation needs:    Medical: Not on file    Non-medical: Not on file  Tobacco Use  . Smoking  status: Former Smoker    Types: Cigarettes  . Smokeless tobacco: Never Used  Substance and Sexual Activity  . Alcohol use: Not on file    Comment: Pt reports socially  . Drug use: No    Types: Marijuana    Comment: Prior history  . Sexual activity: Not Currently  Lifestyle  . Physical activity:    Days per week: Not on file    Minutes per session: Not on file  . Stress: Not on file  Relationships  . Social connections:    Talks on phone: Not on file    Gets together: Not on file    Attends religious service: Not on file    Active member of club or organization: Not on file    Attends meetings of clubs or organizations: Not on file    Relationship status: Not on file  . Intimate partner violence:    Fear of current or ex partner: Not on file    Emotionally abused: Not on file    Physically abused: Not on file    Forced sexual activity: Not on file  Other Topics Concern  . Not on file  Social History Narrative   Patient drinks caffeine about once or twice a week.    Patient is right handed.     Past Surgical History:  Procedure Laterality Date  . HAND SURGERY Right    ORIF  . HAND SURGERY Right     Family History  Problem Relation Age of Onset  . Migraines Mother   . Diabetes Mother   . Diabetes Father   . Diabetes Other   . Migraines Maternal Aunt   . Migraines Maternal Grandmother   . Diabetes Maternal Grandmother   . Colon cancer Neg Hx     Allergies  Allergen Reactions  . Pantoprazole Sodium Other (See Comments)    Shaky, jittery  . Penicillins Hives    Has patient had a PCN reaction causing immediate rash, facial/tongue/throat swelling, SOB or lightheadedness with hypotension: yes Has patient had a PCN reaction causing severe rash involving mucus membranes or skin necrosis: No Has patient had a PCN reaction that required hospitalization no Has patient had a PCN reaction occurring within the last 10 years: Yes If all of the above answers are "NO", then  may proceed with Cephalosporin use.     Current Outpatient Medications on File Prior to Visit  Medication Sig Dispense Refill  . metFORMIN (GLUCOPHAGE-XR) 500 MG 24 hr tablet TAKE 1 TABLET BY MOUTH EVERY DAY 30 tablet 0   No current facility-administered medications on file prior to visit.     BP (!) 116/51 (BP Location: Left Arm, Patient Position: Sitting, Cuff Size: Normal)   Pulse 62   Temp 98 F (36.7 C) (Oral)   Resp 16   Ht 5\' 8"  (1.727 m)   Wt 280 lb 2 oz (127.1 kg)   SpO2 98%   BMI 42.59 kg/m       Objective:   Physical Exam  General Appearance- Not in acute distress.  HEENT Eyes-  Scleraeral/Conjuntiva-bilat- Not Yellow. Mouth & Throat- Normal.  Chest and Lung Exam Auscultation: Breath sounds:-Normal. Adventitious sounds:- No Adventitious sounds.  Cardiovascular Auscultation:Rythm - Regular. Heart Sounds -Normal heart sounds.  Abdomen Inspection:-Inspection Normal.  Palpation/Perucssion: Palpation and Percussion of the abdomen reveal- faint  Tender epigastric and umblicua, No Rebound tenderness, No rigidity(Guarding) and No Palpable abdominal masses.  Liver:-Normal.  Spleen:- Normal.   Back- no cva pain.      Assessment & Plan:  For your history of GERD/dyspepsia and umbilical hernia region pain, I did go ahead and fill out the [facility] paperwork.  You have mentioned that the dates have already passed.  Hopefully they will still except the paperwork.  It was impossible for me to fill out paperwork without knowing how you recovered from the surgery and your level of pain.  Also need to know the restrictions that you were advised post surgery.  Would recommend that you continue healthy diet GI MD advised.  Also gave you prescription of Zantac to get filled downstairs in our pharmacy.  In addition recent been to your pharmacy.  Attend surgeon appointment tomorrow for postop evaluation.  I did put your return to work date was June 02, 2018.  See if your  surgeon agrees with this return date.  If he thinks you need more time off then defer to his expertise.  Follow-up in 3 weeks here or as needed.  40 minutes spent with pt today. 50% of time spent counseling patient diagnosis and treatment going forward as well as on fmla process due to  the fact that timeline on form had passed but she had not followed up as the surgery was relatively recently. Explained impossible for me to fill out form and hopefully they will accept paperwork.

## 2018-06-06 ENCOUNTER — Telehealth: Payer: Self-pay | Admitting: Medical

## 2018-06-06 NOTE — Telephone Encounter (Signed)
I was reviewing fmla paperwork today and saw I had filled out fmla paperwork on patient. I thought I had gotten someone to fax over. But may not have as I still have paperwork. So I will give to you this morning will you fax it over. Put copy to side if need to modify in near future and get copy scanned as well.  Let me know if it had already been send?

## 2018-06-06 NOTE — Telephone Encounter (Signed)
Resent forms to [facility].

## 2018-06-27 ENCOUNTER — Encounter: Payer: Self-pay | Admitting: Medical

## 2018-06-27 ENCOUNTER — Ambulatory Visit (INDEPENDENT_AMBULATORY_CARE_PROVIDER_SITE_OTHER): Payer: 59 | Admitting: Medical

## 2018-06-27 VITALS — BP 108/71 | HR 79 | Temp 97.9°F | Resp 16 | Ht 68.0 in | Wt 274.8 lb

## 2018-06-27 DIAGNOSIS — R350 Frequency of micturition: Secondary | ICD-10-CM | POA: Diagnosis not present

## 2018-06-27 DIAGNOSIS — R300 Dysuria: Secondary | ICD-10-CM

## 2018-06-27 LAB — POC URINALSYSI DIPSTICK (AUTOMATED)
BILIRUBIN UA: NEGATIVE
GLUCOSE UA: NEGATIVE
Nitrite, UA: NEGATIVE
PH UA: 6 (ref 5.0–8.0)
Protein, UA: NEGATIVE
RBC UA: NEGATIVE
Spec Grav, UA: >=1.03 — AB (ref 1.010–1.025)
Urobilinogen, UA: NEGATIVE E.U./dL — AB

## 2018-06-27 MED ORDER — NITROFURANTOIN MONOHYD MACRO 100 MG PO CAPS: 100.0000 mg | ORAL_CAPSULE | Freq: Two times a day (BID) | ORAL | 0 refills | Status: DC

## 2018-06-27 MED ORDER — FLUCONAZOLE 150 MG PO TABS: 150.0000 mg | ORAL_TABLET | Freq: Once | ORAL | 0 refills | Status: AC

## 2018-06-27 NOTE — Patient Instructions (Signed)
Your appear to have a urinary tract infection. I am prescribing macrobid antibiotic for the probable infection. Hydrate well. I am sending out a urine culture. During the interim if your signs and symptoms worsen rather than improving please notify us. We will notify your when the culture results are back.  If any yeast infection type symptoms while on antibiotic then take diflucan  Follow up in 7 days or as needed.

## 2018-06-27 NOTE — Progress Notes (Signed)
Subjective:    Patient ID: [name], female    DOB: 1986-05-12, 32 y.o.   MRN: [id number]  HPI  Pt in today reporting urinary symptoms.  Dysuria- for 2 weeks intermittent mild pain.  Frequent urination-yes Hesitancy-sometimes. Suprapubic pressure-no Fever-no chills-no Nausea-no Vomiting-no  Maybe odor to urine.   Some slight vaginal itch 2 days.But went away quickly. No white dc.  LMP- May 23, 2018. No chance she is pregnant(no sex)   CVA pain-no History of UTI- 1-2 times a year Gross hematuria-no   Review of Systems  Constitutional: Negative for chills, fatigue and fever.  Respiratory: Negative for cough, chest tightness and wheezing.   Cardiovascular: Negative for chest pain and palpitations.  Gastrointestinal: Negative for abdominal pain.  Genitourinary: Positive for dysuria, frequency and urgency. Negative for flank pain, genital sores, hematuria and menstrual problem.  Musculoskeletal: Negative for back pain.  Skin: Negative for rash.  Neurological: Negative for dizziness, numbness and headaches.  Hematological: Negative for adenopathy. Does not bruise/bleed easily.  Psychiatric/Behavioral: Negative for behavioral problems and confusion.    Past Medical History:  Diagnosis Date  . Common migraine 04/14/2015  . Facet arthritis of lumbar region   . GERD (gastroesophageal reflux disease)   . IBS (irritable bowel syndrome)   . Kidney disease   . Migraine   . Morbidly obese ([place])   . Ovarian cyst      Social History   Socioeconomic History  . Marital status: Single    Spouse name: Not on file  . Number of children: 0  . Years of education: 17-18  . Highest education level: Not on file  Occupational History  . Occupation: [employer]  . Occupation: [employer]  Social Needs  . Financial resource strain: Not on file  . Food insecurity:    Worry: Not on file    Inability: Not on file  . Transportation needs:    Medical: Not on file     Non-medical: Not on file  Tobacco Use  . Smoking status: Former Smoker    Types: Cigarettes  . Smokeless tobacco: Never Used  Substance and Sexual Activity  . Alcohol use: Not on file    Comment: Pt reports socially  . Drug use: No    Types: Marijuana    Comment: Prior history  . Sexual activity: Not Currently  Lifestyle  . Physical activity:    Days per week: Not on file    Minutes per session: Not on file  . Stress: Not on file  Relationships  . Social connections:    Talks on phone: Not on file    Gets together: Not on file    Attends religious service: Not on file    Active member of club or organization: Not on file    Attends meetings of clubs or organizations: Not on file    Relationship status: Not on file  . Intimate partner violence:    Fear of current or ex partner: Not on file    Emotionally abused: Not on file    Physically abused: Not on file    Forced sexual activity: Not on file  Other Topics Concern  . Not on file  Social History Narrative   Patient drinks caffeine about once or twice a week.    Patient is right handed.     Past Surgical History:  Procedure Laterality Date  . HAND SURGERY Right    ORIF  . HAND SURGERY Right  Family History  Problem Relation Age of Onset  . Migraines Mother   . Diabetes Mother   . Diabetes Father   . Diabetes Other   . Migraines Maternal Aunt   . Migraines Maternal Grandmother   . Diabetes Maternal Grandmother   . Colon cancer Neg Hx     Allergies  Allergen Reactions  . Pantoprazole Sodium Other (See Comments)    Shaky, jittery  . Penicillins Hives    Has patient had a PCN reaction causing immediate rash, facial/tongue/throat swelling, SOB or lightheadedness with hypotension: yes Has patient had a PCN reaction causing severe rash involving mucus membranes or skin necrosis: No Has patient had a PCN reaction that required hospitalization no Has patient had a PCN reaction occurring within the last 10  years: Yes If all of the above answers are "NO", then may proceed with Cephalosporin use.     Current Outpatient Medications on File Prior to Visit  Medication Sig Dispense Refill  . metFORMIN (GLUCOPHAGE-XR) 500 MG 24 hr tablet TAKE 1 TABLET BY MOUTH EVERY DAY 30 tablet 0  . ranitidine (ZANTAC) 150 MG capsule Take 1 capsule (150 mg total) by mouth 2 (two) times daily. 60 capsule 0   No current facility-administered medications on file prior to visit.     BP 108/71   Pulse 79   Temp 97.9 F (36.6 C) (Oral)   Resp 16   Ht 5\' 8"  (1.727 m)   Wt 274 lb 12.8 oz (124.6 kg)   SpO2 98%   BMI 41.78 kg/m       Objective:   Physical Exam  General Appearance- Not in acute distress.  HEENT Eyes- Scleraeral/Conjuntiva-bilat- Not Yellow. Mouth & Throat- Normal.  Chest and Lung Exam Auscultation: Breath sounds:-Normal. Adventitious sounds:- No Adventitious sounds.  Cardiovascular Auscultation:Rythm - Regular. Heart Sounds -Normal heart sounds.  Abdomen Inspection:-Inspection Normal.  Palpation/Perucssion: Palpation and Percussion of the abdomen reveal- suprapbuic Tenderness, No Rebound tenderness, No rigidity(Guarding) and No Palpable abdominal masses.  Liver:-Normal.  Spleen:- Normal.   Back- no cva tenderness..      Assessment & Plan:  Your appear to have a urinary tract infection. I am prescribing macrobid antibiotic for the probable infection. Hydrate well. I am sending out a urine culture. During the interim if your signs and symptoms worsen rather than improving please notify us. We will notify your when the culture results are back.  If any yeast infection type symptoms while on antibiotic then take diflucan  Follow up in 7 days or as needed.  Mackie Pai, PA-C

## 2018-06-28 ENCOUNTER — Other Ambulatory Visit: Payer: Self-pay | Admitting: Medical

## 2018-06-28 LAB — URINE CULTURE
MICRO NUMBER: 91463728
SPECIMEN QUALITY:: ADEQUATE

## 2018-07-01 MED ORDER — METFORMIN HCL ER 500 MG PO TB24: ORAL_TABLET | ORAL | 0 refills | Status: DC

## 2018-07-07 ENCOUNTER — Other Ambulatory Visit: Payer: Self-pay | Admitting: Medical

## 2018-07-07 MED ORDER — RANITIDINE HCL 150 MG PO CAPS: 150.0000 mg | ORAL_CAPSULE | Freq: Two times a day (BID) | ORAL | 3 refills | Status: DC

## 2018-07-07 NOTE — Addendum Note (Signed)
Addended byDamita Dunnings D on: 07/07/2018 02:36 PM   Modules accepted: Orders

## 2018-07-29 ENCOUNTER — Encounter: Payer: Self-pay | Admitting: Gastroenterology

## 2018-07-29 ENCOUNTER — Ambulatory Visit (INDEPENDENT_AMBULATORY_CARE_PROVIDER_SITE_OTHER): Payer: 59 | Admitting: Gastroenterology

## 2018-07-29 VITALS — BP 108/70 | HR 70 | Ht 68.0 in | Wt 282.4 lb

## 2018-07-29 DIAGNOSIS — K5900 Constipation, unspecified: Secondary | ICD-10-CM | POA: Diagnosis not present

## 2018-07-29 DIAGNOSIS — R1084 Generalized abdominal pain: Secondary | ICD-10-CM

## 2018-07-29 DIAGNOSIS — R1013 Epigastric pain: Secondary | ICD-10-CM

## 2018-07-29 DIAGNOSIS — K921 Melena: Secondary | ICD-10-CM | POA: Diagnosis not present

## 2018-07-29 MED ORDER — SOD PICOSULFATE-MAG OX-CIT ACD 10-3.5-12 MG-GM -GM/160ML PO SOLN: 1.0000 | ORAL | 0 refills | Status: DC

## 2018-07-29 NOTE — Progress Notes (Signed)
P  Chief Complaint:    Constipation, nausea, abdominal pain  GI History: 33 year old female with a history of IBS, GERD, umbilical hernia initially seen by me on 05/01/2018.  She was previously seen by Dr. Cindee Salt at [facility] in [place] in September 2019 for abdominal pain and reflux.  EGD completed and notable for non-H. pylori gastritis.  She was seen in follow-up and diagnosed with functional dyspepsia and IBS, with plan for doxepin 10 mg nightly, Bentyl as needed and continued Protonix 40 mg daily and ordered for CT abdomen pelvis (results as below) and repeat labs and follow-up in 2 months. However, she decided she would like to change her GI provider and was referred to me on 05/01/2018.  CT with umbilical hernia. Hernia repair completed by Dr. Arvin Collard, general surgery, in [id number]/9767 without complication.  Intolerant of Protonix in the past due to "shakes "and nausea.  Started on metformin in 04/2018 for newly diagnosed diabetes.  HPI:     [name] patient was seen by me on 05/01/2018 with a history of IBS and GERD, presenting [facility] for follow-up.  Since that appointment, hernia repair completed by Dr. Arvin Collard in 04/2018.  At the last appointment, plan for trial of low FODMAP diet in lieu of starting new medications.  Given recent EGD and imaging studies, no endoscopy or imaging ordered at that time.  Recommended weight loss for fatty liver disease with follow-up in 3 to 4 months.  Today she states she has been adhering to low FODMAP diet with overall improvement in abdominal pain, but now dealing with constipation. Has tried to increase her water intake, increased dietary fiber, and even tried an OTC laxative without durable relief. Mild associated nausea but no emesis. Having pebble like and thin stools and feeling of incomplete evacuation. +straining. 2-3 BM/week now from baseline 1-2 formed stools/daily. Does endorse hematochezia last  week which is new for her. Only new medication is Metformin (constipation in 1%; started Oct 2019). Did not take post op pain meds due to hives. Does have a prior history of constipation with previous GI however this had not been an issue for many months. Previously prescribed Linzess with success.   Endoscopic history: -Colonoscopy (July 2013): 2 inflammatory polyps, normal TI.  Recommendation to repeat at age 89. -EGD (01/2012): Non-H. pylori gastritis -EGD (03/2018, [place]): Mild antral gastritis  - RUQ ultrasound (03/2018): Normal gallbladder, no duct dilation, fatty liver infiltration - CT (03/2018): Fatty liver, umbilical hernia containing fat and small bowel without obstruction, normal GB, pancreas, spleen.     Review of systems:     No chest pain, no SOB, no fevers, no urinary sx   Past Medical History:  Diagnosis Date  . Common migraine 04/14/2015  . Facet arthritis of lumbar region   . GERD (gastroesophageal reflux disease)   . IBS (irritable bowel syndrome)   . Kidney disease   . Migraine   . Morbidly obese ([place])   . Ovarian cyst     Patient's surgical history, family medical history, social history, medications and allergies were all reviewed in Epic    Current Outpatient Medications  Medication Sig Dispense Refill  . metFORMIN (GLUCOPHAGE-XR) 500 MG 24 hr tablet TAKE 1 TABLET BY MOUTH EVERY DAY 30 tablet 0   No current facility-administered medications for this visit.     Physical Exam:     BP 108/70   Pulse 70   Ht 5\' 8"  (1.727  m)   Wt 282 lb 6 oz (128.1 kg)   BMI 42.93 kg/m   GENERAL:  Pleasant female in NAD PSYCH: : Cooperative, normal affect EENT:  conjunctiva pink, mucous membranes moist, neck supple without masses CARDIAC:  RRR, no murmur heard, no peripheral edema PULM: Normal respiratory effort, lungs CTA bilaterally, no wheezing ABDOMEN:  Nondistended, soft.  Minimal TTP in bilateral lower abdomen. No obvious masses, no hepatomegaly,  normal  bowel sounds SKIN:  turgor, no lesions seen Musculoskeletal:  Normal muscle tone, normal strength NEURO: Alert and oriented x 3, no focal neurologic deficits   IMPRESSION and PLAN:    #1.  Hematochezia: Recent onset BRBPR in the setting of change in bowel habits.  Clinical presentation most likely secondary to benign anorectal disorder (i.e. hemorrhoids, anal fissure) but in the setting of changes in bowel habits, patient heightened concerns, and no previous hematochezia, she would like to proceed with colonoscopy to evaluate for additional, proximal etiology.    - Colonoscopy now  #2.  Constipation: Apparent history of constipation with clinical presentation seemingly most consistent with IBS-C.  Was reportedly previously treated with Linzess.  Will evaluate and treat as below: -Colonoscopy as above to also evaluate for luminal etiology for changes in bowel habits - Resume high-fiber diet and increase dietary fiber with increased fluid intake as already doing -If colonoscopy negative, plan for ARM vs Sitz marker study -Discussed stool softener, linaclotide, lubiprostone, etc.  Wants to hold off on new meds until after diagnostic eval - Resume low FODMAP diet and increased dietary fiber -Encouraged regular exercise   #3.  Abdominal pain: Pain related to IBS-C as above.  Will treat underlying constipation and monitor for clinical improvement.  Additionally discussed the following: - Discussed Bentyl or Symax to take as needed for abdominal pain/cramping - Discussed neuromodulation with TCA, SSRI - Discussed the medical benefits of yoga and regular exercise in all IBS patients - Discussed the medical benefit of probiotics, particularly in patients with bloating and flatulence - Discussed that there is some evidence for the use of nonabsorbable Rifaximin and reduction of global IBS symptoms to include bloating - Some described benefit of glutamine 5 g 3 times daily and IBS patients -  Ultimately she would like to hold off on any medication trials at this time in favor of evaluation as above.  #4.  Nonulcer dyspepsia: Per patient request, holding off on trials of FD guard, PPI, etc.  Some of her pain has certainly abated with low FODMAP diet and will resume with dietary mods for now.  The indications, risks, and benefits of colonoscopy were explained to the patient in detail. Risks include but are not limited to bleeding, perforation, adverse reaction to medications, and cardiopulmonary compromise. Sequelae include but are not limited to the possibility of surgery, hospitalization, and mortality. The patient verbalized understanding and wished to proceed. All questions answered, referred to the scheduler and bowel prep ordered. Further recommendations pending results of the exam.          [place] ,DO, FACG 07/29/2018, 9:09 AM

## 2018-07-29 NOTE — Patient Instructions (Addendum)
If you are age 32 or older, your body mass index should be between 23-30. Your Body mass index is 42.93 kg/m. If this is out of the aforementioned range listed, please consider follow up with your Primary Care Provider.  If you are age 30 or younger, your body mass index should be between 19-25. Your Body mass index is 42.93 kg/m. If this is out of the aformentioned range listed, please consider follow up with your Primary Care Provider.   You have been scheduled for a colonoscopy. Please follow written instructions given to you at your visit today.  Please pick up your prep supplies at the pharmacy within the next 1-3 days. If you use inhalers (even only as needed), please bring them with you on the day of your procedure. Your physician has requested that you go to www.startemmi.com and enter the access code given to you at your visit today. This web site gives a general overview about your procedure. However, you should still follow specific instructions given to you by our office regarding your preparation for the procedure.  We have sent the following medications to your pharmacy for you to pick up at your convenience: Clenpic  Please purchase the following medications over the counter and take as directed: Ducolax  Please call our office at [phone] to set up your 3-6 month follow up visit.  It was a pleasure to see you today!  Vito Cirigliano, D.O.

## 2018-08-05 ENCOUNTER — Ambulatory Visit: Payer: Self-pay | Admitting: *Deleted

## 2018-08-05 NOTE — Telephone Encounter (Signed)
Patient has upper abdominal discomfort she states at the xiphoid area. She has had this pain on/offf over the last year but it has worsened over the last month and is now constant the last 24 hours. Describes as a dull ache that breads tend to make worse. She has been diagnosed with reflux in the past but does not take the medication prescribed. Also has a hx of IBS. Has a colonoscopy this Friday but requesting to see PCP prior to procedure. Denies fever/radiating pain/sour taste/SOB. Appointment made for 08/06/18.  Reason for Disposition . [1] MODERATE pain (e.g., interferes with normal activities) AND [2] comes and goes (cramps) AND [3] present > 24 hours  (Exception: pain with Vomiting or Diarrhea - see that Guideline)  Answer Assessment - Initial Assessment Questions 1. LOCATION: "Where does it hurt?"       Mid center about an inch below the breast bone. Dull heaviness pain. 2. RADIATION: "Does the pain go anywhere else?" (e.g., into neck, jaw, arms, back)     Does not radiate. 3. ONSET: "When did the chest pain begin?" (Minutes, hours or days)      For approximately one week the pain has been present. Since last night it has been constant. 4. PATTERN "Does the pain come and go, or has it been constant since it started?"  "Does it get worse with exertion?"      Constant at this point 5. DURATION: "How long does it last" (e.g., seconds, minutes, hours)     All that time. 6. SEVERITY: "How bad is the pain?"  (e.g., Scale 1-10; mild, moderate, or severe)    - MILD (1-3): doesn't interfere with normal activities     - MODERATE (4-7): interferes with normal activities or awakens from sleep    - SEVERE (8-10): excruciating pain, unable to do any normal activities       Patient states off the scale.  She is at work today. 7. CARDIAC RISK FACTORS: "Do you have any history of heart problems or risk factors for heart disease?" (e.g., prior heart attack, angina; high blood pressure, diabetes, being  overweight, high cholesterol, smoking, or strong family history of heart disease)     obese 8. PULMONARY RISK FACTORS: "Do you have any history of lung disease?"  (e.g., blood clots in lung, asthma, emphysema, birth control pills)     none 9. CAUSE: "What do you think is causing the chest pain?"     unknown 10. OTHER SYMPTOMS: "Do you have any other symptoms?" (e.g., dizziness, nausea, vomiting, sweating, fever, difficulty breathing, cough)       She has reflux been prescribed medication but not taking it.  11. PREGNANCY: "Is there any chance you are pregnant?" "When was your last menstrual period?"      no  Answer Assessment - Initial Assessment Questions 1. LOCATION: "Where does it hurt?"      Mid center at the xiphoid process 2. RADIATION: "Does the pain shoot anywhere else?" (e.g., chest, back)     Does not radiate 3. ONSET: "When did the pain begin?" (e.g., minutes, hours or days ago)      Pain has been off/on for over a month but worsening over last 24 hours.  4. SUDDEN: "Gradual or sudden onset?"     Gradual over time.  5. PATTERN "Does the pain come and go, or is it constant?"    - If constant: "Is it getting better, staying the same, or worsening?"      (  Note: Constant means the pain never goes away completely; most serious pain is constant and it progresses)     - If intermittent: "How long does it last?" "Do you have pain now?"     (Note: Intermittent means the pain goes away completely between bouts)     Pain is constant at this point  6. SEVERITY: "How bad is the pain?"  (e.g., Scale 1-10; mild, moderate, or severe)    - MILD (1-3): doesn't interfere with normal activities, abdomen soft and not tender to touch     - MODERATE (4-7): interferes with normal activities or awakens from sleep, tender to touch     - SEVERE (8-10): excruciating pain, doubled over, unable to do any normal activities       Patient stated 100. Patient is at work today. 7. RECURRENT SYMPTOM: "Have you  ever had this type of abdominal pain before?" If so, ask: "When was the last time?" and "What happened that time?"      Stated she has had this pain in the past and was prescribed reflux medication which she does not take.  8. AGGRAVATING FACTORS: "Does anything seem to cause this pain?" (e.g., foods, stress, alcohol)     Stated foods like bread seam to make it worse, at times.  9. CARDIAC SYMPTOMS: "Do you have any of the following symptoms: chest pain, difficulty breathing, sweating, nausea?"     Denies all these. 10. OTHER SYMPTOMS: "Do you have any other symptoms?" (e.g., fever, vomiting, diarrhea)       None of these. Has problems with constipation and IBS. 11. PREGNANCY: "Is there any chance you are pregnant?" "When was your last menstrual period?"       no  Protocols used: ABDOMINAL PAIN - UPPER-A-AH, CHEST PAIN-A-AH

## 2018-08-06 ENCOUNTER — Encounter: Payer: Self-pay | Admitting: Medical

## 2018-08-06 ENCOUNTER — Ambulatory Visit (INDEPENDENT_AMBULATORY_CARE_PROVIDER_SITE_OTHER): Payer: 59 | Admitting: Medical

## 2018-08-06 VITALS — BP 114/70 | HR 63 | Temp 98.0°F | Resp 16 | Ht 68.0 in | Wt 283.4 lb

## 2018-08-06 DIAGNOSIS — R1013 Epigastric pain: Secondary | ICD-10-CM | POA: Diagnosis not present

## 2018-08-06 LAB — CBC WITH DIFFERENTIAL/PLATELET
BASOS ABS: 0.1 10*3/uL (ref 0.0–0.1)
BASOS PCT: 3 % (ref 0.0–3.0)
EOS PCT: 3.4 % (ref 0.0–5.0)
Eosinophils Absolute: 0.2 10*3/uL (ref 0.0–0.7)
HEMATOCRIT: 37.4 % (ref 36.0–46.0)
HEMOGLOBIN: 11.9 g/dL — AB (ref 12.0–15.0)
LYMPHS PCT: 51.3 % — AB (ref 12.0–46.0)
Lymphs Abs: 2.4 10*3/uL (ref 0.7–4.0)
MCHC: 31.8 g/dL (ref 30.0–36.0)
MCV: 78.7 fl (ref 78.0–100.0)
MONOS PCT: 5.6 % (ref 3.0–12.0)
Monocytes Absolute: 0.3 10*3/uL (ref 0.1–1.0)
NEUTROS ABS: 1.7 10*3/uL (ref 1.4–7.7)
Neutrophils Relative %: 36.7 % — ABNORMAL LOW (ref 43.0–77.0)
Platelets: 400 10*3/uL (ref 150.0–400.0)
RBC: 4.75 Mil/uL (ref 3.87–5.11)
RDW: 16 % — ABNORMAL HIGH (ref 11.5–15.5)
WBC: 4.7 10*3/uL (ref 4.0–10.5)

## 2018-08-06 LAB — COMPREHENSIVE METABOLIC PANEL
ALBUMIN: 3.8 g/dL (ref 3.5–5.2)
ALK PHOS: 60 U/L (ref 39–117)
ALT: 20 U/L (ref 0–35)
AST: 12 U/L (ref 0–37)
BILIRUBIN TOTAL: 0.3 mg/dL (ref 0.2–1.2)
BUN: 12 mg/dL (ref 6–23)
CALCIUM: 9.3 mg/dL (ref 8.4–10.5)
CO2: 30 mEq/L (ref 19–32)
CREATININE: 1.05 mg/dL (ref 0.40–1.20)
Chloride: 107 mEq/L (ref 96–112)
GFR: 78.5 mL/min (ref >60.00)
Glucose, Bld: 137 mg/dL — ABNORMAL HIGH (ref 70–99)
Potassium: 4.7 mEq/L (ref 3.5–5.1)
Sodium: 141 mEq/L (ref 135–145)
Total Protein: 6.9 g/dL (ref 6.0–8.3)

## 2018-08-06 LAB — LIPASE: LIPASE: 32 U/L (ref 11.0–59.0)

## 2018-08-06 LAB — AMYLASE: AMYLASE: 44 U/L (ref 27–131)

## 2018-08-06 MED ORDER — ALUM & MAG HYDROXIDE-SIMETH 200-200-20 MG/5ML PO SUSP
30.0000 mL | Freq: Once | ORAL | Status: AC
  Administered 2018-08-06: 30 mL via ORAL

## 2018-08-06 MED ORDER — FAMOTIDINE 20 MG PO TABS: 20.0000 mg | ORAL_TABLET | Freq: Every day | ORAL | 3 refills | Status: AC

## 2018-08-06 NOTE — Patient Instructions (Signed)
For your epigastric/abdomen pain today, we gave you GI cocktail.  Also will send famotidine prescription to your pharmacy.  Will get CBC, CMP, amylase and lipase today.  If you were to have elevated liver enzymes then would consider getting ultrasound.  We will update you on lab results.  You do have upcoming colonoscopy this Friday.  I do want you to please take 1 Imodium today.  Then resume a famotidine on Saturday.  Any severe change in signs and symptoms/have abdomen pain then recommend ED evaluation.  Follow-up in 7 to 10 days or as needed.  Also you get a chance to talk with your GI MD please give him update on how you responded to the GI cocktail in the famotidine.

## 2018-08-06 NOTE — Progress Notes (Signed)
Subjective:    Patient ID: [name], female    DOB: 1985/10/03, 33 y.o.   MRN: [id number]  HPI  Pt has hx of some chronic epigastric area pain.  Pt has seen surgeon and had umbilical hernia repaired.  She also has seen GI Dr. Foy Guadalajara for the epigastric pain. She has had chronic pain.   1.  Hematochezia: Recent onset BRBPR in the setting of change in bowel habits.  Clinical presentation most likely secondary to benign anorectal disorder (i.e. hemorrhoids, anal fissure) but in the setting of changes in bowel habits, patient heightened concerns, and no previous hematochezia, she would like to proceed with colonoscopy to evaluate for additional, proximal etiology.    - Colonoscopy now  #2.  Constipation: Apparent history of constipation with clinical presentation seemingly most consistent with IBS-C.  Was reportedly previously treated with Linzess.  Will evaluate and treat as below: -Colonoscopy as above to also evaluate for luminal etiology for changes in bowel habits - Resume high-fiber diet and increase dietary fiber with increased fluid intake as already doing -If colonoscopy negative, plan for ARM vs Sitz marker study -Discussed stool softener, linaclotide, lubiprostone, etc.  Wants to hold off on new meds until after diagnostic eval - Resume low FODMAP diet and increased dietary fiber -Encouraged regular exercise   #3.  Abdominal pain: Pain related to IBS-C as above.  Will treat underlying constipation and monitor for clinical improvement.  Additionally discussed the following: - Discussed Bentyl or Symax to take as needed for abdominal pain/cramping - Discussed neuromodulation with TCA, SSRI - Discussed the medical benefits of yoga and regular exercise in all IBS patients - Discussed the medical benefit of probiotics, particularly in patients with bloating and flatulence - Discussed that there is some evidence for the use of nonabsorbable Rifaximin and reduction of  global IBS symptoms to include bloating - Some described benefit of glutamine 5 g 3 times daily and IBS patients - Ultimately she would like to hold off on any medication trials at this time in favor of evaluation as above.  #4.  Nonulcer dyspepsia: Per patient request, holding off on trials of FD guard, PPI, etc.  Some of her pain has certainly abated with low FODMAP diet and will resume with dietary mods for now.  The indications, risks, and benefits of colonoscopy were explained to the patient in detail. Risks include but are not limited to bleeding, perforation, adverse reaction to medications, and cardiopulmonary compromise. Sequelae include but are not limited to the possibility of surgery, hospitalization, and mortality. The patient verbalized understanding and wished to proceed. All questions answered, referred to the scheduler and bowel prep ordered. Further recommendations pending results of the exam.   Pt has colonscopy this Friday.  Pt did burp during interview and states burping recently a lot. She states she tender to burp a lot. Pt was not given any medication for heart burn. Pt states she knows in past has reflux with poor diet.  lmp- irregular menses.(Pt states can't be pregnant). Not active.   Review of Systems  Constitutional: Negative for chills, fatigue and fever.  Respiratory: Negative for cough, chest tightness, shortness of breath and wheezing.   Cardiovascular: Negative for chest pain and palpitations.  Gastrointestinal: Positive for abdominal pain. Negative for abdominal distention, blood in stool, nausea and vomiting.  Musculoskeletal: Negative for back pain.  Skin: Negative for rash.  Neurological: Negative for dizziness, numbness and headaches.  Hematological: Negative for adenopathy. Does not bruise/bleed easily.  Psychiatric/Behavioral: Negative for behavioral problems. The patient is not nervous/anxious.    Past Medical History:  Diagnosis Date  . Common  migraine 04/14/2015  . Facet arthritis of lumbar region   . GERD (gastroesophageal reflux disease)   . IBS (irritable bowel syndrome)   . Kidney disease   . Migraine   . Morbidly obese ([place])   . Ovarian cyst      Social History   Socioeconomic History  . Marital status: Single    Spouse name: Not on file  . Number of children: 0  . Years of education: 17-18  . Highest education level: Not on file  Occupational History  . Occupation: [employer]  . Occupation: [employer]  Social Needs  . Financial resource strain: Not on file  . Food insecurity:    Worry: Not on file    Inability: Not on file  . Transportation needs:    Medical: Not on file    Non-medical: Not on file  Tobacco Use  . Smoking status: Former Smoker    Types: Cigarettes  . Smokeless tobacco: Never Used  Substance and Sexual Activity  . Alcohol use: Yes    Comment: Pt reports socially  . Drug use: No    Types: Marijuana    Comment: Prior history  . Sexual activity: Not Currently  Lifestyle  . Physical activity:    Days per week: Not on file    Minutes per session: Not on file  . Stress: Not on file  Relationships  . Social connections:    Talks on phone: Not on file    Gets together: Not on file    Attends religious service: Not on file    Active member of club or organization: Not on file    Attends meetings of clubs or organizations: Not on file    Relationship status: Not on file  . Intimate partner violence:    Fear of current or ex partner: Not on file    Emotionally abused: Not on file    Physically abused: Not on file    Forced sexual activity: Not on file  Other Topics Concern  . Not on file  Social History Narrative   Patient drinks caffeine about once or twice a week.    Patient is right handed.     Past Surgical History:  Procedure Laterality Date  . HAND SURGERY Right    ORIF  . HAND SURGERY Right   . HERNIA REPAIR  [id number]/[id number]   umbilical with [facility]     Family  History  Problem Relation Age of Onset  . Migraines Mother   . Diabetes Mother   . Diabetes Father   . Diabetes Other   . Migraines Maternal Aunt   . Migraines Maternal Grandmother   . Diabetes Maternal Grandmother   . Colon cancer Neg Hx   . Esophageal cancer Neg Hx     Allergies  Allergen Reactions  . Pantoprazole Sodium Other (See Comments)    Shaky, jittery  . Penicillins Hives    Has patient had a PCN reaction causing immediate rash, facial/tongue/throat swelling, SOB or lightheadedness with hypotension: yes Has patient had a PCN reaction causing severe rash involving mucus membranes or skin necrosis: No Has patient had a PCN reaction that required hospitalization no Has patient had a PCN reaction occurring within the last 10 years: Yes If all of the above answers are "NO", then may proceed with Cephalosporin use.     Current Outpatient  Medications on File Prior to Visit  Medication Sig Dispense Refill  . metFORMIN (GLUCOPHAGE-XR) 500 MG 24 hr tablet TAKE 1 TABLET BY MOUTH EVERY DAY 30 tablet 0  . Sod Picosulfate-Mag Ox-Cit Acd (CLENPIQ) 10-3.5-12 MG-GM -GM/160ML SOLN Take 1 kit by mouth as directed. 2 Bottle 0   No current facility-administered medications on file prior to visit.     BP 114/70   Pulse 63   Temp 98 F (36.7 C) (Oral)   Resp 16   Ht 5' 8"  (1.727 m)   Wt 283 lb 6.4 oz (128.5 kg)   SpO2 100%   BMI 43.09 kg/m       Objective:   Physical Exam  General- No acute distress. Pleasant patient. Neck- Full range of motion, no jvd Lungs- Clear, even and unlabored. Heart- regular rate and rhythm. Neurologic- CNII- XII grossly intact.  Abdomen- soft, mild-moderate direct epigastric tenderness to palpation. No rebound or guarding. No lower quadrant pain.  Back- no cva tenderness.     Assessment & Plan:  For your epigastric/abdomen pain today, we gave you GI cocktail.  Also will send famotidine prescription to your pharmacy.  Will get CBC, CMP,  amylase and lipase today.  If you were to have elevated liver enzymes then would consider getting ultrasound.  We will update you on lab results.  You do have upcoming colonoscopy this Friday.  I do want you to please take 1 Imodium today.  Then resume a famotidine on Saturday.  Any severe change in signs and symptoms/have abdomen pain then recommend ED evaluation.  Follow-up in 7 to 10 days or as needed.  Also you get a chance to talk with your GI MD please give him update on how you responded to the GI cocktail in the famotidine.  Mackie Pai, PA-C

## 2018-08-07 ENCOUNTER — Other Ambulatory Visit: Payer: Self-pay | Admitting: Medical

## 2018-08-07 MED ORDER — METFORMIN HCL ER 500 MG PO TB24: ORAL_TABLET | ORAL | 2 refills | Status: DC

## 2018-08-08 ENCOUNTER — Encounter: Payer: Self-pay | Admitting: Gastroenterology

## 2018-08-08 ENCOUNTER — Ambulatory Visit (AMBULATORY_SURGERY_CENTER): Payer: 59 | Admitting: Gastroenterology

## 2018-08-08 VITALS — BP 105/70 | HR 56 | Temp 99.3°F | Resp 10 | Ht 68.0 in | Wt 283.0 lb

## 2018-08-08 DIAGNOSIS — K635 Polyp of colon: Secondary | ICD-10-CM | POA: Diagnosis not present

## 2018-08-08 DIAGNOSIS — D120 Benign neoplasm of cecum: Secondary | ICD-10-CM

## 2018-08-08 DIAGNOSIS — K921 Melena: Secondary | ICD-10-CM

## 2018-08-08 DIAGNOSIS — K5909 Other constipation: Secondary | ICD-10-CM

## 2018-08-08 MED ORDER — SODIUM CHLORIDE 0.9 % IV SOLN: 500.0000 mL | Freq: Once | INTRAVENOUS | Status: AC

## 2018-08-08 NOTE — Progress Notes (Signed)
Called to room to assist during endoscopic procedure.  Patient ID and intended procedure confirmed with present staff. Received instructions for my participation in the procedure from the performing physician.  

## 2018-08-08 NOTE — Progress Notes (Signed)
PT taken to PACU. Monitors in place. VSS. Report given to RN. 

## 2018-08-08 NOTE — Patient Instructions (Signed)
Impression/Recommendations:  Polyp handout given to patient.  Resume previous diet. Continue present medications.  Await pathology results.  Repeat colonoscopy for surveillance.  Date to be determined after pathology results reviewed.  Return to GI clinic in 3 months.  YOU HAD AN ENDOSCOPIC PROCEDURE TODAY AT [facility]:   Refer to the procedure report that was given to you for any specific questions about what was found during the examination.  If the procedure report does not answer your questions, please call your gastroenterologist to clarify.  If you requested that your care partner not be given the details of your procedure findings, then the procedure report has been included in a sealed envelope for you to review at your convenience later.  YOU SHOULD EXPECT: Some feelings of bloating in the abdomen. Passage of more gas than usual.  Walking can help get rid of the air that was put into your GI tract during the procedure and reduce the bloating. If you had a lower endoscopy (such as a colonoscopy or flexible sigmoidoscopy) you may notice spotting of blood in your stool or on the toilet paper. If you underwent a bowel prep for your procedure, you may not have a normal bowel movement for a few days.  Please Note:  You might notice some irritation and congestion in your nose or some drainage.  This is from the oxygen used during your procedure.  There is no need for concern and it should clear up in a day or so.  SYMPTOMS TO REPORT IMMEDIATELY:   Following lower endoscopy (colonoscopy or flexible sigmoidoscopy):  Excessive amounts of blood in the stool  Significant tenderness or worsening of abdominal pains  Swelling of the abdomen that is new, acute  Fever of 100F or higher For urgent or emergent issues, a gastroenterologist can be reached at any hour by calling [phone].   DIET:  We do recommend a small meal at first, but then you may proceed to your  regular diet.  Drink plenty of fluids but you should avoid alcoholic beverages for 24 hours.  ACTIVITY:  You should plan to take it easy for the rest of today and you should NOT DRIVE or use heavy machinery until tomorrow (because of the sedation medicines used during the test).    FOLLOW UP: Our staff will call the number listed on your records the next business day following your procedure to check on you and address any questions or concerns that you may have regarding the information given to you following your procedure. If we do not reach you, we will leave a message.  However, if you are feeling well and you are not experiencing any problems, there is no need to return our call.  We will assume that you have returned to your regular daily activities without incident.  If any biopsies were taken you will be contacted by phone or by letter within the next 1-3 weeks.  Please call us at [phone] if you have not heard about the biopsies in 3 weeks.    SIGNATURES/CONFIDENTIALITY: You and/or your care partner have signed paperwork which will be entered into your electronic medical record.  These signatures attest to the fact that that the information above on your After Visit Summary has been reviewed and is understood.  Full responsibility of the confidentiality of this discharge information lies with you and/or your care-partner.

## 2018-08-08 NOTE — Op Note (Signed)
[place] Patient Name: [name] Procedure Date: 08/08/2018 3:25 PM MRN: [id number] Endoscopist: Gerrit Heck , MD Age: 33 Referring MD:  Date of Birth: 05/14/1986 Gender: Female Account #: [id number] Procedure:                Colonoscopy Indications:              Hematochezia, Constipation Medicines:                Monitored Anesthesia Care Procedure:                Pre-Anesthesia Assessment:                           - Prior to the procedure, a History and Physical                            was performed, and patient medications and                            allergies were reviewed. The patient's tolerance of                            previous anesthesia was also reviewed. The risks                            and benefits of the procedure and the sedation                            options and risks were discussed with the patient.                            All questions were answered, and informed consent                            was obtained. Prior Anticoagulants: The patient has                            taken no previous anticoagulant or antiplatelet                            agents. ASA Grade Assessment: III - A patient with                            severe systemic disease. After reviewing the risks                            and benefits, the patient was deemed in                            satisfactory condition to undergo the procedure.                           After obtaining informed consent, the colonoscope  was passed under direct vision. Throughout the                            procedure, the patient's blood pressure, pulse, and                            oxygen saturations were monitored continuously. The                            Colonoscope was introduced through the anus and                            advanced to the the terminal ileum. The colonoscopy                            was performed without  difficulty. The patient                            tolerated the procedure well. The quality of the                            bowel preparation was adequate. Scope In: 3:38:54 PM Scope Out: 3:54:57 PM Scope Withdrawal Time: 0 hours 13 minutes 45 seconds  Total Procedure Duration: 0 hours 16 minutes 3 seconds  Findings:                 The perianal and digital rectal examinations were                            normal.                           A 11 mm polyp was found in the cecum. The polyp was                            semi-pedunculated. The polyp was removed with a                            cold snare. Resection and retrieval were complete.                            Estimated blood loss was minimal.                           The exam was otherwise normal throughout the                            remainder of the colon. There were no areas of                            mucosal erythema, edema, erosions, or ulceration.                            No luminal narrowing or stricture noted.  Retroflexion in the right colon was performed.                           The retroflexed view of the distal rectum and anal                            verge was normal and showed no anal or rectal                            abnormalities.                           The terminal ileum appeared normal. Complications:            No immediate complications. Estimated Blood Loss:     Estimated blood loss was minimal. Impression:               - One 11 mm polyp in the cecum, removed with a cold                            snare. Resected and retrieved.                           - The distal rectum and anal verge are normal on                            retroflexion view.                           - The examined portion of the ileum was normal. Recommendation:           - Patient has a contact number available for                            emergencies. The signs and symptoms of  potential                            delayed complications were discussed with the                            patient. Return to normal activities tomorrow.                            Written discharge instructions were provided to the                            patient.                           - Resume previous diet today.                           - Continue present medications.                           - Await pathology results.                           -  Repeat colonoscopy for surveillance based on                            pathology results.                           - Return to GI clinic in 3 months. Gerrit Heck, MD 08/08/2018 4:00:01 PM

## 2018-08-11 ENCOUNTER — Telehealth: Payer: Self-pay

## 2018-08-11 NOTE — Telephone Encounter (Signed)
  Follow up Call-  Call back number 08/08/2018  Post procedure Call Back phone  # [phone]  Permission to leave phone message Yes  Some recent data might be hidden     Patient questions:  Do you have a fever, pain , or abdominal swelling? No. Pain Score  0 *  Have you tolerated food without any problems? Yes.    Have you been able to return to your normal activities? Yes.    Do you have any questions about your discharge instructions: Diet   No. Medications  No. Follow up visit  No.  Do you have questions or concerns about your Care? Yes.    Actions: * If pain score is 4 or above: No action needed, pain <4.  Pt c/o having epigastric pain before procedure.  She said pain is worse when she eats.  I asked pt if she had discussed this with Dr. Bryan Lemma and she said she mentions it before colonoscopy started.  I told her she needed to call and make and appointment to explain to Dr. Bryan Lemma these sx.  Pt said she would call for an appointment. maw

## 2018-08-15 ENCOUNTER — Encounter: Payer: Self-pay | Admitting: Gastroenterology

## 2018-08-18 ENCOUNTER — Telehealth: Payer: Self-pay | Admitting: Gastroenterology

## 2018-08-18 ENCOUNTER — Ambulatory Visit (INDEPENDENT_AMBULATORY_CARE_PROVIDER_SITE_OTHER): Payer: 59 | Admitting: Gastroenterology

## 2018-08-18 ENCOUNTER — Encounter: Payer: Self-pay | Admitting: Gastroenterology

## 2018-08-18 VITALS — BP 108/72 | HR 64 | Ht 68.0 in | Wt 287.5 lb

## 2018-08-18 DIAGNOSIS — R1013 Epigastric pain: Secondary | ICD-10-CM | POA: Diagnosis not present

## 2018-08-18 DIAGNOSIS — K5900 Constipation, unspecified: Secondary | ICD-10-CM

## 2018-08-18 DIAGNOSIS — K921 Melena: Secondary | ICD-10-CM

## 2018-08-18 MED ORDER — BARIUM SULFATE PO CAPS: 1.0000 | ORAL_CAPSULE | Freq: Once | ORAL | 0 refills | Status: AC

## 2018-08-18 NOTE — Patient Instructions (Addendum)
If you are age 32 or older, your body mass index should be between 23-30. Your Body mass index is 43.71 kg/m. If this is out of the aforementioned range listed, please consider follow up with your Primary Care Provider.  If you are age 74 or younger, your body mass index should be between 19-25. Your Body mass index is 43.71 kg/m. If this is out of the aformentioned range listed, please consider follow up with your Primary Care Provider.   You were given a Sitz Marker tablet today. Your provider has requested that you have an abdominal x ray on Saturday 08/23/2018. The Radiology Department at [facility] is open from 7:30am - 5:30pm. Do not take and laxatives or fiber supplements until after your X-ray.  You have been give a sample of  FDGard(20 Capsules)  It was a pleasure to see you today!  Vito Cirigliano, D.O.

## 2018-08-18 NOTE — Telephone Encounter (Signed)
Please see additional documentation concerning this patient-patient was seen in the office today

## 2018-08-18 NOTE — Progress Notes (Signed)
P  Chief Complaint:    Upper abdominal pain  GI History: 33 year old female with a history of IBS, GERD, umbilical hernia initially seen by me on 05/01/2018 with f/u appt on 07/29/2018.  She was previously seen by Dr.Jue at [facility] in [place] in September 2019 for abdominal pain and reflux. EGD completed and notable for non-H. pylori gastritis. She was seen in follow-up and diagnosed with functional dyspepsia and IBS, with plan for doxepin 10 mg nightly, Bentyl as needed and continued Protonix 40 mg daily and ordered for CT abdomen pelvis (results as below) and repeat labs and follow-up in 2 months. However, she decided she would like to change her GI provider and was referred to me on 05/01/2018.  CT with umbilical hernia. Hernia repair completed by Dr. Arvin Collard, general surgery, in 95/0932 without complication.  Intolerant of Protonix in the past due to "shakes "and nausea.  Started on metformin in 04/2018 for newly diagnosed diabetes.  Started on low FODMAP diet with overall improvement in abdominal pain, but at time of last appt was endorsing constipation. Does have a pre-existing hx of constipation, to include relief when prescribed Linzess in past, but was new issue at that time. Sxs persisted despite increased water intake, increased dietary fiber, and OTC laxative without durable relief. C/o pebble like stools with straining and new hematochezia. She was evaluated with colonoscopy which was only n/f an 11 mm inflammatory cecal polyp, but o/w normal.   HPI:     [name] presenting to the [facility] as a walk-in today for follow-up. She was last seen by me on 07/29/2018 with Colonoscopy on 08/08/2018 as outlined above.   Today, she states she is having upper abdominal pain tha she feels is worse since last appt. Pain worst post prandial, typically immediate and can last through the day. Labs normal on 08/06/18 when having sxs. H pylori  breath test negative in 04/2018. Was prescribed Pepcid by PCM on 1/15, but has not trialed yet.  Still having issues with constipation, although thinks this has improved somewhat since last appointment with high-fiber diet, increased fluid intake, low FODMAP.  Colonoscopy otherwise without luminal/mucosal etiology for constipation.  Endoscopic history: -Colonoscopy (July 2013): 2 inflammatory polyps, normal TI. Recommendation to repeat at age 63. -EGD (01/2012): Non-H. pylori gastritis -EGD (03/2018, [facility]): Mild antral gastritis - Colonoscopy (07/2018): 11 mm inflammatory cecal polyp, with o/w normal colon and TI  -RUQ ultrasound (03/2018): Normal gallbladder, no duct dilation, fatty liver infiltration -CT (03/2018): Fatty liver, umbilical hernia containing fat and small bowel without obstruction, normal GB, pancreas, spleen.   Review of systems:     No chest pain, no SOB, no fevers, no urinary sx   Past Medical History:  Diagnosis Date  . Common migraine 04/14/2015  . Facet arthritis of lumbar region   . GERD (gastroesophageal reflux disease)   . IBS (irritable bowel syndrome)   . Kidney disease   . Migraine   . Morbidly obese ([place])   . Ovarian cyst     Patient's surgical history, family medical history, social history, medications and allergies were all reviewed in Epic    Current Outpatient Medications  Medication Sig Dispense Refill  . famotidine (PEPCID) 20 MG tablet Take 1 tablet (20 mg total) by mouth daily. (Patient not taking: Reported on 08/08/2018) 30 tablet 3  . metFORMIN (GLUCOPHAGE-XR) 500 MG 24 hr tablet TAKE 1 TABLET BY MOUTH EVERY DAY 30 tablet 2   Current  Facility-Administered Medications  Medication Dose Route Frequency Provider Last Rate Last Dose  . 0.9 %  sodium chloride infusion  500 mL Intravenous Once Darrick Greenlaw, Dominic Pea, DO        Physical Exam:     LMP 08/02/2018 (Exact Date)   GENERAL:  Pleasant female in NAD PSYCH: : Cooperative, normal  affect EENT:  conjunctiva pink, mucous membranes moist, neck supple without masses CARDIAC:  RRR, no murmur heard, no peripheral edema PULM: Normal respiratory effort, lungs CTA bilaterally, no wheezing ABDOMEN:  Nondistended, soft, nontender. No obvious masses, no hepatomegaly,  normal bowel sounds SKIN:  turgor, no lesions seen Musculoskeletal:  Normal muscle tone, normal strength NEURO: Alert and oriented x 3, no focal neurologic deficits   IMPRESSION and PLAN:    #1.  Abdominal pain: Suspect her abdominal pain to be multifactorial, potentially secondary to both nonulcer dyspepsia (gastritis noted on prior EGD in fall 2019) along with some component of constipation induced abdominal pain.  Plan to treat as below:  - FD Guard.  Provided with sample today - Take Pepcid as prescribed by [facility] -Resume dietary modifications as already doing     #2.  Constipation: Chronic constipation with some improvement since implementing low FODMAP diet, increased fiber supplement, increase fluid intake.  Colonoscopy otherwise unrevealing.  Discussed probable diagnosis of IBS-C at length and will proceed as below:  -Sitz marker study. Administered capsule in office today. Placed order for X-ray in 5 days - Discussed trials of Bentyl, Symax etc, which she would like to hold off on for now - Discussed medications for chronic constipation, and would like to hold off pending study as above -Resume dietary modifications, fiber supplement, increase fluid intake as already doing -Encouraged regular exercise -Briefly discussed additional medication treatment strategies, but would like to hold off pending diagnostic work-up above  #3.  Hematochezia: Resolved with dietary modification as above.   RTC in 3 to 6 months or sooner as needed     I spent a total of 25 minutes of face-to-face time with the patient. Greater than 50% of the time was spent counseling and coordinating care.   [name] ,DO,  FACG 08/18/2018, 11:32 AM

## 2018-08-23 ENCOUNTER — Encounter (HOSPITAL_BASED_OUTPATIENT_CLINIC_OR_DEPARTMENT_OTHER): Payer: Self-pay | Admitting: Radiology

## 2018-08-23 ENCOUNTER — Ambulatory Visit (HOSPITAL_BASED_OUTPATIENT_CLINIC_OR_DEPARTMENT_OTHER)
Admission: RE | Admit: 2018-08-23 | Discharge: 2018-08-23 | Disposition: A | Discharge status: Home / Self Care | Payer: 59 | Source: Ambulatory Visit | Attending: Gastroenterology | Admitting: Gastroenterology

## 2018-08-23 DIAGNOSIS — K5900 Constipation, unspecified: Secondary | ICD-10-CM

## 2018-08-23 DIAGNOSIS — R1013 Epigastric pain: Secondary | ICD-10-CM | POA: Diagnosis not present

## 2018-08-26 ENCOUNTER — Telehealth: Payer: Self-pay | Admitting: Gastroenterology

## 2018-08-26 NOTE — Telephone Encounter (Signed)
Patient returned call to office-patient is requesting to know if the paperwork that she brought to her 07/29/2018 OV has been filled out and ready for pick up or if the office is needing another copy of the paperwork so it can be filled out for her job-patient reports this paperwork is not for FMLA however it is for accommodations at her job;  Please advise

## 2018-08-26 NOTE — Telephone Encounter (Signed)
Left message for patient to call back  

## 2018-08-27 NOTE — Telephone Encounter (Signed)
Left a detailed message on VM concerning paperwork that HS been completed and is ready for pick up from the office; advised patient to call back to office if questions/concerns arise;

## 2018-08-27 NOTE — Telephone Encounter (Signed)
Yes, this is complete and available for pick up at the office. Thanks.

## 2018-09-05 ENCOUNTER — Ambulatory Visit (INDEPENDENT_AMBULATORY_CARE_PROVIDER_SITE_OTHER): Payer: 59 | Admitting: Medical

## 2018-09-05 ENCOUNTER — Ambulatory Visit (HOSPITAL_BASED_OUTPATIENT_CLINIC_OR_DEPARTMENT_OTHER)
Admission: RE | Admit: 2018-09-05 | Discharge: 2018-09-05 | Disposition: A | Discharge status: Home / Self Care | Payer: 59 | Source: Ambulatory Visit | Attending: Medical | Admitting: Medical

## 2018-09-05 ENCOUNTER — Encounter: Payer: Self-pay | Admitting: Medical

## 2018-09-05 VITALS — BP 128/76 | HR 82 | Temp 98.4°F | Resp 16 | Ht 68.0 in | Wt 283.8 lb

## 2018-09-05 DIAGNOSIS — R062 Wheezing: Secondary | ICD-10-CM

## 2018-09-05 DIAGNOSIS — R05 Cough: Secondary | ICD-10-CM | POA: Diagnosis present

## 2018-09-05 DIAGNOSIS — R059 Cough, unspecified: Secondary | ICD-10-CM

## 2018-09-05 DIAGNOSIS — J0100 Acute maxillary sinusitis, unspecified: Secondary | ICD-10-CM | POA: Diagnosis not present

## 2018-09-05 DIAGNOSIS — R067 Sneezing: Secondary | ICD-10-CM | POA: Diagnosis not present

## 2018-09-05 MED ORDER — HYDROCODONE-HOMATROPINE 5-1.5 MG/5ML PO SYRP: 5.0000 mL | ORAL_SOLUTION | Freq: Four times a day (QID) | ORAL | 0 refills | Status: AC | PRN

## 2018-09-05 MED ORDER — ALBUTEROL SULFATE HFA 108 (90 BASE) MCG/ACT IN AERS: 2.0000 | INHALATION_SPRAY | Freq: Four times a day (QID) | RESPIRATORY_TRACT | 2 refills | Status: AC | PRN

## 2018-09-05 MED ORDER — AZITHROMYCIN 250 MG PO TABS: ORAL_TABLET | ORAL | 0 refills | Status: DC

## 2018-09-05 MED ORDER — FLUTICASONE PROPIONATE 50 MCG/ACT NA SUSP: 2.0000 | Freq: Every day | NASAL | 1 refills | Status: AC

## 2018-09-05 NOTE — Patient Instructions (Addendum)
You appear to have a sinus infection and bronchitis(some allergy component possible based on severe sneezing). I am prescribing antibiotic azithromycin for the infection. To help with the nasal congestion I prescribed nasal steroid flonase. For your associated cough, I prescribed cough medicine hycodan.  Will get cxr due to lingering cough weeks.  Rx albuterol to use if needed for wheezing.   Rest, hydrate, tylenol for fever.  Follow up in 7 days or as needed.

## 2018-09-05 NOTE — Progress Notes (Signed)
Subjective:    Patient ID: [name], female    DOB: 1986/07/18, 33 y.o.   MRN: [id number]  HPI  Pt in with some persisting cough. She states cough was mild and intermittent for about one month. But over past week cough has been severe. She states will almost dry heave or vomit at times when cough is so severe. Also got congested over past week. No fever, no chills or obvious sweats. Pt is blowing out some mucus from her nose. He maxillary sinus area is burning. Also of sneezing as well.  LMP- 3 and half weeks ago.   Review of Systems  Constitutional: Positive for fatigue. Negative for chills and fever.       Pt not sleeping well due to cough.  HENT: Positive for congestion, postnasal drip, sinus pressure, sinus pain and sneezing.   Respiratory: Positive for cough and wheezing.        Rarely will get some mucus up when coughs.  Cardiovascular: Negative for chest pain and palpitations.  Gastrointestinal: Negative for abdominal pain.  Genitourinary: Negative for dysuria.  Musculoskeletal: Negative for back pain.  Neurological: Negative for dizziness and light-headedness.  Hematological: Negative for adenopathy. Does not bruise/bleed easily.  Psychiatric/Behavioral: Negative for behavioral problems and confusion.    Past Medical History:  Diagnosis Date  . Common migraine 04/14/2015  . Facet arthritis of lumbar region   . GERD (gastroesophageal reflux disease)   . IBS (irritable bowel syndrome)   . Kidney disease   . Migraine   . Morbidly obese ([place])   . Ovarian cyst      Social History   Socioeconomic History  . Marital status: Single    Spouse name: Not on file  . Number of children: 0  . Years of education: 17-18  . Highest education level: Not on file  Occupational History  . Occupation: [employer]  . Occupation: [employer]  Social Needs  . Financial resource strain: Not on file  . Food insecurity:    Worry: Not on file    Inability: Not on file    . Transportation needs:    Medical: Not on file    Non-medical: Not on file  Tobacco Use  . Smoking status: Former Smoker    Types: Cigarettes  . Smokeless tobacco: Never Used  Substance and Sexual Activity  . Alcohol use: Yes    Comment: Pt reports socially  . Drug use: No    Types: Marijuana    Comment: Prior history  . Sexual activity: Not Currently  Lifestyle  . Physical activity:    Days per week: Not on file    Minutes per session: Not on file  . Stress: Not on file  Relationships  . Social connections:    Talks on phone: Not on file    Gets together: Not on file    Attends religious service: Not on file    Active member of club or organization: Not on file    Attends meetings of clubs or organizations: Not on file    Relationship status: Not on file  . Intimate partner violence:    Fear of current or ex partner: Not on file    Emotionally abused: Not on file    Physically abused: Not on file    Forced sexual activity: Not on file  Other Topics Concern  . Not on file  Social History Narrative   Patient drinks caffeine about once or twice a week.  Patient is right handed.     Past Surgical History:  Procedure Laterality Date  . HAND SURGERY Right    ORIF  . HAND SURGERY Right   . HERNIA REPAIR  54/62/7035   umbilical with [facility]     Family History  Problem Relation Age of Onset  . Migraines Mother   . Diabetes Mother   . Diabetes Father   . Diabetes Other   . Migraines Maternal Aunt   . Migraines Maternal Grandmother   . Diabetes Maternal Grandmother   . Colon cancer Neg Hx   . Esophageal cancer Neg Hx     Allergies  Allergen Reactions  . Pantoprazole Sodium Other (See Comments)    Shaky, jittery  . Penicillins Hives    Has patient had a PCN reaction causing immediate rash, facial/tongue/throat swelling, SOB or lightheadedness with hypotension: yes Has patient had a PCN reaction causing severe rash involving mucus membranes or skin necrosis:  No Has patient had a PCN reaction that required hospitalization no Has patient had a PCN reaction occurring within the last 10 years: Yes If all of the above answers are "NO", then may proceed with Cephalosporin use.     Current Outpatient Medications on File Prior to Visit  Medication Sig Dispense Refill  . famotidine (PEPCID) 20 MG tablet Take 1 tablet (20 mg total) by mouth daily. 30 tablet 3  . metFORMIN (GLUCOPHAGE-XR) 500 MG 24 hr tablet TAKE 1 TABLET BY MOUTH EVERY DAY 30 tablet 2   Current Facility-Administered Medications on File Prior to Visit  Medication Dose Route Frequency Provider Last Rate Last Dose  . 0.9 %  sodium chloride infusion  500 mL Intravenous Once Cirigliano, Vito V, DO        There were no vitals taken for this visit.      Objective:   Physical Exam  General  Mental Status - Alert. General Appearance - Well groomed. Not in acute distress.  Skin Rashes- No Rashes.  HEENT Head- Normal. Ear Auditory Canal - Left- Normal. Right - Normal.Tympanic Membrane- Left- Normal. Right- Normal. Eye Sclera/Conjunctiva- Left- Normal. Right- Normal. Nose & Sinuses Nasal Mucosa- Left-  Boggy and Congested. Right-  Boggy and  Congested.Bilateral maxillary but  frontal sinus pressure. Mouth & Throat Lips: Upper Lip- Normal: no dryness, cracking, pallor, cyanosis, or vesicular eruption. Lower Lip-Normal: no dryness, cracking, pallor, cyanosis or vesicular eruption. Buccal Mucosa- Bilateral- No Aphthous ulcers. Oropharynx- No Discharge or Erythema. +pnd Tonsils: Characteristics- Bilateral- No Erythema or Congestion. Size/Enlargement- Bilateral- No enlargement. Discharge- bilateral-None.  Neck Neck- Supple. No Masses.   Chest and Lung Exam Auscultation: Breath Sounds:-Clear even and unlabored.  Cardiovascular Auscultation:Rythm- Regular, rate and rhythm. Murmurs & Other Heart Sounds:Ausculatation of the heart reveal- No Murmurs.  Lymphatic Head &  Neck General Head & Neck Lymphatics: Bilateral: Description- No Localized lymphadenopathy.       Assessment & Plan:  You appear to have a sinus infection and bronchitis(some allergy component possible based on severe sneezing). I am prescribing antibiotic azithromycin for the infection. To help with the nasal congestion I prescribed nasal steroid flonase. For your associated cough, I prescribed cough medicine hycodan.  Will get cxr due to lingering cough weeks.  Rx albuterol to use if needed for wheezing.   Rest, hydrate, tylenol for fever.  Follow up in 7 days or as needed.

## 2019-02-16 ENCOUNTER — Ambulatory Visit (HOSPITAL_COMMUNITY)
Admission: EM | Admit: 2019-02-16 | Discharge: 2019-02-16 | Disposition: A | Discharge status: Home / Self Care | Payer: 59 | Attending: Emergency Medicine | Admitting: Emergency Medicine

## 2019-02-16 ENCOUNTER — Encounter (HOSPITAL_COMMUNITY): Payer: Self-pay

## 2019-02-16 ENCOUNTER — Other Ambulatory Visit: Payer: Self-pay

## 2019-02-16 DIAGNOSIS — B9689 Other specified bacterial agents as the cause of diseases classified elsewhere: Secondary | ICD-10-CM | POA: Diagnosis not present

## 2019-02-16 DIAGNOSIS — N730 Acute parametritis and pelvic cellulitis: Secondary | ICD-10-CM

## 2019-02-16 LAB — POCT URINALYSIS DIP (DEVICE)
Bilirubin Urine: NEGATIVE
Glucose, UA: NEGATIVE mg/dL
Hgb urine dipstick: NEGATIVE
Ketones, ur: NEGATIVE mg/dL
Nitrite: NEGATIVE
Protein, ur: NEGATIVE mg/dL
Specific Gravity, Urine: >=1.03 (ref 1.005–1.030)
Urobilinogen, UA: 0.2 mg/dL (ref 0.0–1.0)
pH: 5.5 (ref 5.0–8.0)

## 2019-02-16 LAB — POCT PREGNANCY, URINE: Preg Test, Ur: NEGATIVE

## 2019-02-16 MED ORDER — CEFTRIAXONE SODIUM 250 MG IJ SOLR
INTRAMUSCULAR | Status: AC
  Filled 2019-02-16: qty 250

## 2019-02-16 MED ORDER — CEFTRIAXONE SODIUM 250 MG IJ SOLR
250.0000 mg | Freq: Once | INTRAMUSCULAR | Status: AC
  Administered 2019-02-16: 250 mg via INTRAMUSCULAR

## 2019-02-16 MED ORDER — DOXYCYCLINE HYCLATE 100 MG PO CAPS: 100.0000 mg | ORAL_CAPSULE | Freq: Two times a day (BID) | ORAL | 0 refills | Status: AC

## 2019-02-16 NOTE — ED Provider Notes (Signed)
[facility]    CSN: [id number] Arrival date & time: 02/16/19  1059     History   Chief Complaint Chief Complaint  Patient presents with  . Pelvic Pain    HPI Laketia Vicknair is a 33 y.o. female history of migraine, obesity presenting for 1 week course of intermittent dull/achy pelvic pain.  Patient will intermittently have sharp shooting pains that radiate to her left lower quadrant.  Patient has history of chlamydia, status post treatment.  Currently sexually active, not using condoms.  LMP 7/7.  Endorsing umbilical hernia replacement last year that was without complications.   Past Medical History:  Diagnosis Date  . Common migraine 04/14/2015  . Facet arthritis of lumbar region   . GERD (gastroesophageal reflux disease)   . IBS (irritable bowel syndrome)   . Kidney disease   . Migraine   . Morbidly obese ([place])   . Ovarian cyst     Patient Active Problem List   Diagnosis Date Noted  . Common migraine 04/14/2015  . Plantar fasciitis 02/28/2015  . Hammertoe 02/28/2015    Past Surgical History:  Procedure Laterality Date  . HAND SURGERY Right    ORIF  . HAND SURGERY Right   . HERNIA REPAIR  42/59/5638   umbilical with [facility]     OB History    Gravida      Para      Term      Preterm      AB      Living  0     SAB      TAB      Ectopic      Multiple      Live Births               Home Medications    Prior to Admission medications   Medication Sig Start Date End Date Taking? Authorizing Provider  albuterol (PROVENTIL HFA;VENTOLIN HFA) 108 (90 Base) MCG/ACT inhaler Inhale 2 puffs into the lungs every 6 (six) hours as needed for wheezing or shortness of breath. 09/05/18   Saguier, Percell Miller, PA-C  doxycycline (VIBRAMYCIN) 100 MG capsule Take 1 capsule (100 mg total) by mouth 2 (two) times daily for 14 days. 02/16/19 03/02/19  Hall-Potvin, [place], PA-C  famotidine (PEPCID) 20 MG tablet Take 1 tablet (20 mg total) by mouth daily.  08/06/18   Saguier, Percell Miller, PA-C  fluticasone (FLONASE) 50 MCG/ACT nasal spray Place 2 sprays into both nostrils daily. 09/05/18   Saguier, Percell Miller, PA-C  HYDROcodone-homatropine (HYCODAN) 5-1.5 MG/5ML syrup Take 5 mLs by mouth every 6 (six) hours as needed. 09/05/18   Saguier, Percell Miller, PA-C  metFORMIN (GLUCOPHAGE-XR) 500 MG 24 hr tablet TAKE 1 TABLET BY MOUTH EVERY DAY 08/07/18   Saguier, Percell Miller, PA-C    Family History Family History  Problem Relation Age of Onset  . Migraines Mother   . Diabetes Mother   . Diabetes Father   . Diabetes Other   . Migraines Maternal Aunt   . Migraines Maternal Grandmother   . Diabetes Maternal Grandmother   . Colon cancer Neg Hx   . Esophageal cancer Neg Hx     Social History Social History   Tobacco Use  . Smoking status: Former Smoker    Types: Cigarettes  . Smokeless tobacco: Never Used  Substance Use Topics  . Alcohol use: Yes    Comment: Pt reports socially  . Drug use: No    Types: Marijuana    Comment: Prior history  Allergies   Pantoprazole sodium and Penicillins   Review of Systems Review of Systems  Constitutional: Negative for fatigue and fever.  HENT: Negative for ear pain, sinus pain, sore throat and voice change.   Eyes: Negative for pain, redness and visual disturbance.  Respiratory: Negative for cough and shortness of breath.   Cardiovascular: Negative for chest pain and palpitations.  Gastrointestinal: Negative for abdominal pain, diarrhea, nausea and vomiting.  Genitourinary: Positive for dysuria, pelvic pain and urgency. Negative for flank pain, frequency, hematuria, vaginal bleeding, vaginal discharge and vaginal pain.  Musculoskeletal: Negative for arthralgias and myalgias.  Skin: Negative for rash and wound.  Neurological: Negative for syncope and headaches.     Physical Exam Triage Vital Signs ED Triage Vitals  Enc Vitals Group     BP 02/16/19 1203 116/81     Pulse Rate 02/16/19 1203 71     Resp 02/16/19  1203 18     Temp 02/16/19 1203 98.2 F (36.8 C)     Temp Source 02/16/19 1203 Oral     SpO2 02/16/19 1203 94 %     Weight --      Height --      Head Circumference --      Peak Flow --      Pain Score 02/16/19 1204 8     Pain Loc --      Pain Edu? --      Excl. in [place]? --    No data found.  Updated Vital Signs BP 116/81 (BP Location: Right Arm)   Pulse 71   Temp 98.2 F (36.8 C) (Oral)   Resp 18   LMP 01/26/2019   SpO2 94%   Visual Acuity Right Eye Distance:   Left Eye Distance:   Bilateral Distance:    Right Eye Near:   Left Eye Near:    Bilateral Near:     Physical Exam Constitutional:      General: She is not in acute distress. HENT:     Head: Normocephalic and atraumatic.  Eyes:     General: No scleral icterus.    Pupils: Pupils are equal, round, and reactive to light.  Cardiovascular:     Rate and Rhythm: Normal rate.  Pulmonary:     Effort: Pulmonary effort is normal.  Genitourinary:    General: Normal vulva.     Comments: General vault with mild amount of non-malodorous discharge.  Cervix is erythematous with white petechiae.  Positive CMT.  Uterus is mobile, nontender.  No adnexal tenderness or masses appreciated. Skin:    Coloration: Skin is not jaundiced or pale.  Neurological:     Mental Status: She is alert and oriented to person, place, and time.      UC Treatments / Results  Labs (all labs ordered are listed, but only abnormal results are displayed) Labs Reviewed  POCT URINALYSIS DIP (DEVICE) - Abnormal; Notable for the following components:      Result Value   Leukocytes,Ua TRACE (*)    All other components within normal limits  POC URINE PREG, ED  POCT PREGNANCY, URINE  CERVICOVAGINAL ANCILLARY ONLY    EKG   Radiology No results found.  Procedures Procedures (including critical care time)  Medications Ordered in UC Medications  cefTRIAXone (ROCEPHIN) injection 250 mg (has no administration in time range)    Initial  Impression / Assessment and Plan / UC Course  I have reviewed the triage vital signs and the nursing notes.  Pertinent labs &  imaging results that were available during my care of the patient were reviewed by me and considered in my medical decision making (see chart for details).     1.  PID Patient given Rocephin in office which she tolerated well.  Will begin a course of doxycycline today and follow-up with PCP.  Return precautions discussed, patient verbalized understanding and is agreeable to plan. Final Clinical Impressions(s) / UC Diagnoses   Final diagnoses:  PID (acute pelvic inflammatory disease)     Discharge Instructions     Take doxycycline as prescribed with food. Return if you develop worsening pain, bleeding, urinary frequency, fever.    ED Prescriptions    Medication Sig Dispense Auth. Provider   doxycycline (VIBRAMYCIN) 100 MG capsule Take 1 capsule (100 mg total) by mouth 2 (two) times daily for 14 days. 28 capsule Hall-Potvin, [place], PA-C     Controlled Substance Prescriptions [place] Controlled Substance Registry consulted? Not Applicable   Quincy Sheehan, [place] 02/16/19 1318

## 2019-02-16 NOTE — Discharge Instructions (Signed)
Take doxycycline as prescribed with food. Return if you develop worsening pain, bleeding, urinary frequency, fever.

## 2019-02-16 NOTE — ED Triage Notes (Signed)
Pt presents with pelvic pain X 7 days with no discharge or vaginal changes.

## 2019-02-18 LAB — CERVICOVAGINAL ANCILLARY ONLY
Chlamydia: NEGATIVE
Neisseria Gonorrhea: NEGATIVE
Trichomonas: NEGATIVE

## 2019-03-08 ENCOUNTER — Other Ambulatory Visit: Payer: Self-pay | Admitting: Medical

## 2019-03-10 NOTE — Telephone Encounter (Signed)
LM for pt to call and schedule f/u appt

## 2019-03-10 NOTE — Telephone Encounter (Signed)
Pt is dues for follow up before more refills.

## 2019-04-09 ENCOUNTER — Other Ambulatory Visit: Payer: Self-pay | Admitting: Medical

## 2019-04-09 NOTE — Telephone Encounter (Signed)
Pt is due for follow up. Please call to schedule appointment.

## 2019-04-10 NOTE — Telephone Encounter (Signed)
LMOVM for pt to call office back to schedule f/u appt with provider.

## 2019-05-06 ENCOUNTER — Other Ambulatory Visit: Payer: Self-pay

## 2019-05-06 ENCOUNTER — Emergency Department (HOSPITAL_COMMUNITY)
Admission: EM | Admit: 2019-05-06 | Discharge: 2019-05-06 | Disposition: A | Discharge status: Home / Self Care | Payer: Self-pay | Attending: Emergency Medicine | Admitting: Emergency Medicine

## 2019-05-06 ENCOUNTER — Encounter (HOSPITAL_COMMUNITY): Payer: Self-pay

## 2019-05-06 ENCOUNTER — Emergency Department (HOSPITAL_COMMUNITY): Payer: Self-pay

## 2019-05-06 DIAGNOSIS — M545 Low back pain, unspecified: Secondary | ICD-10-CM

## 2019-05-06 DIAGNOSIS — Z79899 Other long term (current) drug therapy: Secondary | ICD-10-CM | POA: Insufficient documentation

## 2019-05-06 DIAGNOSIS — Z7984 Long term (current) use of oral hypoglycemic drugs: Secondary | ICD-10-CM | POA: Insufficient documentation

## 2019-05-06 DIAGNOSIS — Z87891 Personal history of nicotine dependence: Secondary | ICD-10-CM | POA: Insufficient documentation

## 2019-05-06 DIAGNOSIS — M546 Pain in thoracic spine: Secondary | ICD-10-CM | POA: Insufficient documentation

## 2019-05-06 DIAGNOSIS — X500XXD Overexertion from strenuous movement or load, subsequent encounter: Secondary | ICD-10-CM | POA: Insufficient documentation

## 2019-05-06 LAB — URINALYSIS, ROUTINE W REFLEX MICROSCOPIC
Bacteria, UA: NONE SEEN
Bilirubin Urine: NEGATIVE
Glucose, UA: NEGATIVE mg/dL
Hgb urine dipstick: NEGATIVE
Ketones, ur: 20 mg/dL — AB
Nitrite: NEGATIVE
Protein, ur: 30 mg/dL — AB
Specific Gravity, Urine: 1.028 (ref 1.005–1.030)
pH: 5 (ref 5.0–8.0)

## 2019-05-06 LAB — PREGNANCY, URINE: Preg Test, Ur: NEGATIVE

## 2019-05-06 MED ORDER — KETOROLAC TROMETHAMINE 60 MG/2ML IM SOLN
30.0000 mg | Freq: Once | INTRAMUSCULAR | Status: AC
  Administered 2019-05-06: 21:00:00 30 mg via INTRAMUSCULAR
  Filled 2019-05-06: qty 2

## 2019-05-06 MED ORDER — ACETAMINOPHEN 325 MG PO TABS
650.0000 mg | ORAL_TABLET | Freq: Once | ORAL | Status: AC
  Administered 2019-05-06: 19:00:00 650 mg via ORAL
  Filled 2019-05-06: qty 2

## 2019-05-06 MED ORDER — METHOCARBAMOL 500 MG PO TABS: 500.0000 mg | ORAL_TABLET | Freq: Three times a day (TID) | ORAL | 0 refills | Status: AC | PRN

## 2019-05-06 MED ORDER — NAPROXEN 500 MG PO TABS: 500.0000 mg | ORAL_TABLET | Freq: Two times a day (BID) | ORAL | 0 refills | Status: AC

## 2019-05-06 NOTE — ED Provider Notes (Signed)
[facility] DEPT Provider Note   CSN: [id number] Arrival date & time: 05/06/19  1617     History   Chief Complaint Chief Complaint  Patient presents with  . Back Pain    HPI [name] is a 33 y.o. female with a hx of morbid obesity, migraines, IBS, GERD, & facet arthritis of L spine region who presents to the ED with back pain x 1 month that acutely worsened after chiropractor adjustment yesterday. 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 She states she decided to try going to the chiropractor for this yesterday and that the chiropractor "adjusted" things and she had a lot of pain during the appointment and now has worsened pain than prior to when she went in for the appointment. Pain is severe, worse with  Movement, at time radiates to the back of each leg with certain movements/position changes, no alleviating factors. No intervention PTA. She does have recurrent UTIs but states back pain does not feel similar to this. Denies numbness, tingling, weakness, saddle anesthesia, incontinence to bowel/bladder, fever, chills, IV drug use, or hx of cancer. Patient has not had prior back surgeries.       HPI  Past Medical History:  Diagnosis Date  . Common migraine 04/14/2015  . Facet arthritis of lumbar region   . GERD (gastroesophageal reflux disease)   . IBS (irritable bowel syndrome)   . Kidney disease   . Migraine   . Morbidly obese ([place])   . Ovarian cyst     Patient Active Problem List   Diagnosis Date Noted  . Common migraine 04/14/2015  . Plantar fasciitis 02/28/2015  . Hammertoe 02/28/2015    Past Surgical History:  Procedure Laterality Date  . HAND SURGERY Right    ORIF  . HAND SURGERY Right   . HERNIA  REPAIR  0000000   umbilical with [facility]      OB History    Gravida      Para      Term      Preterm      AB      Living  0     SAB      TAB      Ectopic      Multiple      Live Births               Home Medications    Prior to Admission medications   Medication Sig Start Date End Date Taking? Authorizing Provider  albuterol (PROVENTIL HFA;VENTOLIN HFA) 108 (90 Base) MCG/ACT inhaler Inhale 2 puffs into the lungs every 6 (six) hours as needed for wheezing or shortness of breath. 09/05/18   Saguier, Percell Miller, PA-C  famotidine (PEPCID) 20 MG tablet Take 1 tablet (20 mg total) by mouth daily. 08/06/18   Saguier, Percell Miller, PA-C  fluticasone (FLONASE) 50 MCG/ACT nasal spray Place 2 sprays into both nostrils daily. 09/05/18   Saguier, Percell Miller, PA-C  HYDROcodone-homatropine (HYCODAN) 5-1.5 MG/5ML syrup Take 5 mLs by mouth every 6 (six) hours as needed. 09/05/18   Saguier, Percell Miller, PA-C  metFORMIN (GLUCOPHAGE-XR) 500 MG 24 hr tablet TAKE 1 TABLET BY MOUTH EVERY DAY 04/09/19   Saguier, Percell Miller, PA-C    Family History Family History  Problem Relation Age  of Onset  . Migraines Mother   . Diabetes Mother   . Diabetes Father   . Diabetes Other   . Migraines Maternal Aunt   . Migraines Maternal Grandmother   . Diabetes Maternal Grandmother   . Colon cancer Neg Hx   . Esophageal cancer Neg Hx     Social History Social History   Tobacco Use  . Smoking status: Former Smoker    Types: Cigarettes  . Smokeless tobacco: Never Used  Substance Use Topics  . Alcohol use: Yes    Comment: Pt reports socially  . Drug use: No    Types: Marijuana    Comment: Prior history     Allergies   Pantoprazole sodium and Penicillins   Review of Systems Review of Systems Constitutional: Negative for chills and fever.  Respiratory: Negative for shortness of breath.   Cardiovascular: Negative for chest pain.  Gastrointestinal: Negative for abdominal pain, nausea and vomiting.   Genitourinary: Negative for dysuria and vaginal discharge.  Musculoskeletal: Positive for back pain.  Neurological: Negative for weakness and numbness.       Negative for incontinence or saddle anesthesia.     Physical Exam Updated Vital Signs BP 131/75 (BP Location: Left Arm)   Pulse 90   Temp 98.8 F (37.1 C) (Oral)   Resp 16   LMP 04/11/2019 Comment: Neg preg test  SpO2 100%   Physical Exam Constitutional:      General: She is not in acute distress.    Appearance: She is well-developed. She is not toxic-appearing.  HENT:     Head: Normocephalic and atraumatic.  Neck:     Musculoskeletal: Normal range of motion and neck supple. No spinous process tenderness or muscular tenderness.  Abdominal:     General: There is no distension.     Palpations: Abdomen is soft.     Tenderness: There is no abdominal tenderness. There is no right CVA tenderness, left CVA tenderness, guarding or rebound.  Musculoskeletal:     Comments: No obvious deformity, appreciable swelling, erythema, ecchymosis, significant open wounds, or increased warmth.  Extremities: Normal ROM. Nontender.  Back: Diffusely tender to palpation to the thoracic & lumbar region including midline & bilateral paraspinal muscles. No point/focal vertebral tenderness, no palpable step off or crepitus.  Skin:    General: Skin is warm and dry.     Findings: No rash.  Neurological:     Mental Status: She is alert.     Deep Tendon Reflexes:     Reflex Scores:      Patellar reflexes are 2+ on the right side and 2+ on the left side.    Comments: Sensation grossly intact to bilateral lower extremities. 5/5 symmetric strength with plantar/dorsiflexion bilaterally. Gait is antalgic but intact without obvious foot drop.    ED Treatments / Results  Labs (all labs ordered are listed, but only abnormal results are displayed) Labs Reviewed  URINALYSIS, ROUTINE W REFLEX MICROSCOPIC - Abnormal; Notable for the following components:       Result Value   APPearance HAZY (*)    Ketones, ur 20 (*)    Protein, ur 30 (*)    Leukocytes,Ua TRACE (*)    All other components within normal limits  PREGNANCY, URINE    EKG None  Radiology Dg Thoracic Spine 2 View  Result Date: 05/06/2019 CLINICAL DATA:  32 year old female with back pain. EXAM: THORACIC SPINE 2 VIEWS; LUMBAR SPINE - COMPLETE 4+ VIEW COMPARISON:  Chest radiograph dated 09/05/2018 FINDINGS:  There is no acute fracture or subluxation of the thoracic or lumbar spine. Mild degenerative changes and chronic compression of the midthoracic vertebra. The visualized posterior elements are intact. The soft tissues are grossly unremarkable. IMPRESSION: No acute/traumatic thoracic or lumbar spine pathology. Electronically Signed   By: Anner Crete M.D.   On: 05/06/2019 20:30   Dg Lumbar Spine Complete  Result Date: 05/06/2019 CLINICAL DATA:  33 year old female with back pain. EXAM: THORACIC SPINE 2 VIEWS; LUMBAR SPINE - COMPLETE 4+ VIEW COMPARISON:  Chest radiograph dated 09/05/2018 FINDINGS: There is no acute fracture or subluxation of the thoracic or lumbar spine. Mild degenerative changes and chronic compression of the midthoracic vertebra. The visualized posterior elements are intact. The soft tissues are grossly unremarkable. IMPRESSION: No acute/traumatic thoracic or lumbar spine pathology. Electronically Signed   By: Anner Crete M.D.   On: 05/06/2019 20:30    Procedures Procedures (including critical care time)  Medications Ordered in ED Medications  ketorolac (TORADOL) injection 30 mg (has no administration in time range)  acetaminophen (TYLENOL) tablet 650 mg (650 mg Oral Given 05/06/19 1918)    Initial Impression / Assessment and Plan / ED Course  I have reviewed the triage vital signs and the nursing notes.  Pertinent labs & imaging results that were available during my care of the patient were reviewed by me and considered in my medical decision making  (see chart for details).    Patient presents with complaint of back pain.  Patient is nontoxic appearing, vitals are WNL on my assessment- initial tachycardia normalized. Diffusely tender throughout T/L spine midline & bilateral paraspinal muscles- w/ recent increase in pain s/p chiropractic treatment & midline tenderness xray was obtained & negative for traumatic injury, degenerative changes noted which are chronic. Patient has normal neurologic exam, no point/focal midline tenderness to palpation or palpable step off. He is ambulatory in the ED.  No back pain red flags. No urinary sxs, given recurrent UTIs checked UA- no UTI. Not pregnant. Most likely muscle strain versus spasm. Considered disc disease, UTI/pyelonephritis, kidney stone, aortic aneurysm/dissection, cauda equina or epidural abscess however these do not fit clinical picture at this time. Will treat with Naproxen and Robaxin, discussed with patient that they are not to drive or operate heavy machinery while taking Robaxin. I discussed treatment plan, need for PCP follow-up, and return precautions with the patient. Provided opportunity for questions, patient confirmed understanding and is in agreement with plan.      Final Clinical Impressions(s) / ED Diagnoses   Final diagnoses:  Acute bilateral low back pain, unspecified whether sciatica present    ED Discharge Orders         Ordered    naproxen (NAPROSYN) 500 MG tablet  2 times daily     05/06/19 2049    methocarbamol (ROBAXIN) 500 MG tablet  Every 8 hours PRN     05/06/19 2049           Sahana Boyland, [place] R, PA-C 05/06/19 2050    Sherwood Gambler, MD 05/07/19 1506

## 2019-05-06 NOTE — ED Triage Notes (Addendum)
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 Pt that she was stiff. Pt states that she went to see chiropractor, and she was in more pain since.  Pt also c/o sciatic pain

## 2019-05-06 NOTE — Discharge Instructions (Signed)
You were seen in the emergency department for back pain today.  At this time we suspect that your pain is related to a muscle strain/spasm.  Your xrays did not show any traumatic injury Your pregnancy test was negative.  Your urine had some ketones and protein- be sure to stay well hydrated and have this rechecked by primary care.   I have prescribed you an anti-inflammatory medication and a muscle relaxer.  - Naproxen is a nonsteroidal anti-inflammatory medication that will help with pain and swelling. Be sure to take this medication as prescribed with food, 1 pill every 12 hours,  It should be taken with food, as it can cause stomach upset, and more seriously, stomach bleeding. Do not take other nonsteroidal anti-inflammatory medications with this such as Advil, Motrin, Aleve, Mobic, Goodie Powder, or Motrin.    - Robaxin is the muscle relaxer I have prescribed, this is meant to help with muscle tightness. Be aware that this medication may make you drowsy therefore the first time you take this it should be at a time you are in an environment where you can rest. Do not drive or operate heavy machinery when taking this medication. Do not drink alcohol or take other sedating medications with this medicine such as narcotics or benzodiazepines.   You make take Tylenol per over the counter dosing with these medications.   We have prescribed you new medication(s) today. Discuss the medications prescribed today with your pharmacist as they can have adverse effects and interactions with your other medicines including over the counter and prescribed medications. Seek medical evaluation if you start to experience new or abnormal symptoms after taking one of these medicines, seek care immediately if you start to experience difficulty breathing, feeling of your throat closing, facial swelling, or rash as these could be indications of a more serious allergic reaction   The application of heat can help soothe the  pain.  Maintaining your daily activities, including walking, is encourged, as it will help you get better faster than just staying in bed.  Your pain should get better over the next 2 weeks.  You will need to follow up with  Your primary healthcare provider in 1-2 weeks for reassessment, if you do not have a primary care provider one is provided in your discharge instructions- you may see the [place] clinic or call the provided phone number. However return to the ER should you develop ne or worsening symptoms or any other concerns including but not limited to severe or worsening pain, low back pain with fever, numbness, weakness, loss of bowel or bladder control, or inability to walk or urinate, you should return to the ER immediately.
